# Patient Record
Sex: Female | Born: 1980 | Hispanic: Yes | Smoking: Never smoker
Health system: Southern US, Community
[De-identification: ages and names within clinical notes are randomized; demographics above are authoritative.]

## PROBLEM LIST (undated history)

## (undated) DIAGNOSIS — Z789 Other specified health status: Secondary | ICD-10-CM

## (undated) HISTORY — PX: NO PAST SURGERIES: SHX2092

---

## 2009-11-01 ENCOUNTER — Ambulatory Visit: Payer: Self-pay | Admitting: Obstetrics and Gynecology

## 2009-11-01 ENCOUNTER — Inpatient Hospital Stay (HOSPITAL_COMMUNITY): Admission: AD | Admit: 2009-11-01 | Discharge: 2009-11-01 | Payer: Self-pay | Admitting: Obstetrics & Gynecology

## 2010-01-06 ENCOUNTER — Ambulatory Visit (HOSPITAL_COMMUNITY): Admission: RE | Admit: 2010-01-06 | Discharge: 2010-01-06 | Payer: Self-pay | Admitting: Family Medicine

## 2010-05-21 ENCOUNTER — Inpatient Hospital Stay (HOSPITAL_COMMUNITY)
Admission: AD | Admit: 2010-05-21 | Discharge: 2010-05-23 | Payer: Self-pay | Source: Home / Self Care | Admitting: Obstetrics & Gynecology

## 2010-05-21 ENCOUNTER — Ambulatory Visit: Payer: Self-pay | Admitting: Obstetrics & Gynecology

## 2010-10-28 LAB — RPR: RPR Ser Ql: NONREACTIVE

## 2010-10-28 LAB — CBC
HCT: 34.6 % — ABNORMAL LOW (ref 36.0–46.0)
Hemoglobin: 11.9 g/dL — ABNORMAL LOW (ref 12.0–15.0)
MCH: 34.3 pg — ABNORMAL HIGH (ref 26.0–34.0)
MCHC: 34.4 g/dL (ref 30.0–36.0)
MCHC: 34.5 g/dL (ref 30.0–36.0)
Platelets: 257 10*3/uL (ref 150–400)
RBC: 3.47 MIL/uL — ABNORMAL LOW (ref 3.87–5.11)
RBC: 4.02 MIL/uL (ref 3.87–5.11)
RDW: 14.2 % (ref 11.5–15.5)
WBC: 11.6 10*3/uL — ABNORMAL HIGH (ref 4.0–10.5)

## 2010-11-08 LAB — WET PREP, GENITAL: Yeast Wet Prep HPF POC: NONE SEEN

## 2010-11-08 LAB — CBC
MCV: 95.4 fL (ref 78.0–100.0)
RBC: 4.25 MIL/uL (ref 3.87–5.11)
RDW: 13.4 % (ref 11.5–15.5)
WBC: 11.4 10*3/uL — ABNORMAL HIGH (ref 4.0–10.5)

## 2010-11-08 LAB — URINALYSIS, ROUTINE W REFLEX MICROSCOPIC
Glucose, UA: NEGATIVE mg/dL
Hgb urine dipstick: NEGATIVE
Nitrite: NEGATIVE
Urobilinogen, UA: 0.2 mg/dL (ref 0.0–1.0)

## 2010-11-08 LAB — ABO/RH: ABO/RH(D): O POS

## 2012-04-23 ENCOUNTER — Emergency Department (HOSPITAL_COMMUNITY)
Admission: EM | Admit: 2012-04-23 | Discharge: 2012-04-23 | Disposition: A | Discharge status: Home / Self Care | Payer: No Typology Code available for payment source | Attending: Emergency Medicine | Admitting: Emergency Medicine

## 2012-04-23 ENCOUNTER — Encounter (HOSPITAL_COMMUNITY): Payer: Self-pay | Admitting: *Deleted

## 2012-04-23 ENCOUNTER — Emergency Department (HOSPITAL_COMMUNITY): Payer: Self-pay

## 2012-04-23 DIAGNOSIS — M545 Low back pain, unspecified: Secondary | ICD-10-CM | POA: Insufficient documentation

## 2012-04-23 DIAGNOSIS — R109 Unspecified abdominal pain: Secondary | ICD-10-CM | POA: Insufficient documentation

## 2012-04-23 LAB — POCT PREGNANCY, URINE
Preg Test, Ur: NEGATIVE
Preg Test, Ur: NEGATIVE

## 2012-04-23 LAB — URINALYSIS, ROUTINE W REFLEX MICROSCOPIC
Bilirubin Urine: NEGATIVE
Glucose, UA: NEGATIVE mg/dL
Hgb urine dipstick: NEGATIVE
Ketones, ur: NEGATIVE mg/dL
Leukocytes, UA: NEGATIVE
Nitrite: NEGATIVE
Protein, ur: NEGATIVE mg/dL
Specific Gravity, Urine: 1.005 (ref 1.005–1.030)
Urobilinogen, UA: 0.2 mg/dL (ref 0.0–1.0)
pH: 6.5 (ref 5.0–8.0)

## 2012-04-23 MED ORDER — CYCLOBENZAPRINE HCL 10 MG PO TABS: 10.0000 mg | ORAL_TABLET | Freq: Two times a day (BID) | ORAL | Status: AC | PRN

## 2012-04-23 MED ORDER — OXYCODONE-ACETAMINOPHEN 5-325 MG PO TABS: 1.0000 | ORAL_TABLET | Freq: Four times a day (QID) | ORAL | Status: AC | PRN

## 2012-04-23 MED ORDER — OXYCODONE-ACETAMINOPHEN 5-325 MG PO TABS
1.0000 | ORAL_TABLET | Freq: Once | ORAL | Status: AC
  Administered 2012-04-23: 1 via ORAL
  Filled 2012-04-23: qty 1

## 2012-04-23 NOTE — ED Notes (Signed)
Pt discharged home with husband; ambulatory; states back pain is better
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
## 2012-04-23 NOTE — ED Notes (Signed)
Pt does not speak any english, waiting on husband to arrive.
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
## 2014-03-24 ENCOUNTER — Other Ambulatory Visit (HOSPITAL_COMMUNITY): Payer: Self-pay | Admitting: Nurse Practitioner

## 2014-03-24 DIAGNOSIS — Z3682 Encounter for antenatal screening for nuchal translucency: Secondary | ICD-10-CM

## 2014-03-26 ENCOUNTER — Encounter (HOSPITAL_COMMUNITY): Payer: Self-pay

## 2014-03-26 ENCOUNTER — Ambulatory Visit (HOSPITAL_COMMUNITY)
Admission: RE | Admit: 2014-03-26 | Discharge: 2014-03-26 | Disposition: A | Discharge status: Home / Self Care | Payer: Medicaid Other | Source: Ambulatory Visit | Attending: Nurse Practitioner | Admitting: Nurse Practitioner

## 2014-03-26 ENCOUNTER — Other Ambulatory Visit: Payer: Self-pay

## 2014-03-26 DIAGNOSIS — Z3682 Encounter for antenatal screening for nuchal translucency: Secondary | ICD-10-CM

## 2014-03-26 DIAGNOSIS — Z36 Encounter for antenatal screening of mother: Secondary | ICD-10-CM | POA: Diagnosis not present

## 2014-03-26 DIAGNOSIS — O3510X0 Maternal care for (suspected) chromosomal abnormality in fetus, unspecified, not applicable or unspecified: Secondary | ICD-10-CM | POA: Insufficient documentation

## 2014-03-26 DIAGNOSIS — O351XX0 Maternal care for (suspected) chromosomal abnormality in fetus, not applicable or unspecified: Secondary | ICD-10-CM | POA: Diagnosis not present

## 2014-04-18 ENCOUNTER — Other Ambulatory Visit (HOSPITAL_COMMUNITY): Payer: Self-pay | Admitting: Nurse Practitioner

## 2014-04-18 DIAGNOSIS — Z3689 Encounter for other specified antenatal screening: Secondary | ICD-10-CM

## 2014-05-09 ENCOUNTER — Ambulatory Visit (HOSPITAL_COMMUNITY)
Admission: RE | Admit: 2014-05-09 | Discharge: 2014-05-09 | Disposition: A | Discharge status: Home / Self Care | Payer: Medicaid Other | Source: Ambulatory Visit | Attending: Nurse Practitioner | Admitting: Nurse Practitioner

## 2014-05-09 ENCOUNTER — Other Ambulatory Visit (HOSPITAL_COMMUNITY): Payer: Self-pay | Admitting: Nurse Practitioner

## 2014-05-09 DIAGNOSIS — Z1389 Encounter for screening for other disorder: Secondary | ICD-10-CM | POA: Diagnosis not present

## 2014-05-09 DIAGNOSIS — Z3689 Encounter for other specified antenatal screening: Secondary | ICD-10-CM | POA: Diagnosis present

## 2014-05-09 DIAGNOSIS — Z363 Encounter for antenatal screening for malformations: Secondary | ICD-10-CM | POA: Insufficient documentation

## 2014-06-16 ENCOUNTER — Encounter (HOSPITAL_COMMUNITY): Payer: Self-pay

## 2014-07-18 LAB — OB RESULTS CONSOLE ABO/RH: RH TYPE: NEGATIVE

## 2014-07-18 LAB — OB RESULTS CONSOLE GC/CHLAMYDIA
Chlamydia: NEGATIVE
GC PROBE AMP, GENITAL: NEGATIVE

## 2014-07-18 LAB — OB RESULTS CONSOLE HEPATITIS B SURFACE ANTIGEN: Hepatitis B Surface Ag: NEGATIVE

## 2014-07-18 LAB — OB RESULTS CONSOLE HIV ANTIBODY (ROUTINE TESTING): HIV: NONREACTIVE

## 2014-07-18 LAB — OB RESULTS CONSOLE RUBELLA ANTIBODY, IGM: Rubella: IMMUNE

## 2014-07-18 LAB — OB RESULTS CONSOLE RPR: RPR: NONREACTIVE

## 2014-08-15 NOTE — L&D Delivery Note (Cosign Needed)
Delivery Note At 2103 a viable female was delivered via SVD (Presentation: OA ).  APGAR: 9,9 ; weight pending  .   Placenta status: spontaneous,intact .  Cord: 3v with the following complications:none .  Cord pH: na Anesthesia:  none Episiotomy:  none Lacerations:  none Suture Repair: na Est. Blood Loss (mL):  300 ml  Mom to postpartum.  Baby to Couplet care / Skin to Skin.  Rolm BookbinderMoss, Amber 09/25/2014, 9:15 PM   I was present for the delivery and agree with the above

## 2014-09-04 LAB — OB RESULTS CONSOLE GBS: GBS: NEGATIVE

## 2014-09-25 ENCOUNTER — Inpatient Hospital Stay (HOSPITAL_COMMUNITY)
Admission: AD | Admit: 2014-09-25 | Discharge: 2014-09-27 | DRG: 775 | Disposition: A | Discharge status: Home / Self Care | Payer: Medicaid Other | Source: Ambulatory Visit | Attending: Obstetrics & Gynecology | Admitting: Obstetrics & Gynecology

## 2014-09-25 ENCOUNTER — Encounter (HOSPITAL_COMMUNITY): Payer: Self-pay | Admitting: *Deleted

## 2014-09-25 DIAGNOSIS — Z3A39 39 weeks gestation of pregnancy: Secondary | ICD-10-CM | POA: Diagnosis present

## 2014-09-25 DIAGNOSIS — IMO0001 Reserved for inherently not codable concepts without codable children: Secondary | ICD-10-CM

## 2014-09-25 DIAGNOSIS — O471 False labor at or after 37 completed weeks of gestation: Secondary | ICD-10-CM | POA: Diagnosis present

## 2014-09-25 HISTORY — DX: Other specified health status: Z78.9

## 2014-09-25 LAB — TYPE AND SCREEN
ABO/RH(D): O POS
Antibody Screen: NEGATIVE

## 2014-09-25 LAB — CBC
HEMATOCRIT: 40.8 % (ref 36.0–46.0)
Hemoglobin: 14 g/dL (ref 12.0–15.0)
MCH: 32.3 pg (ref 26.0–34.0)
MCHC: 34.3 g/dL (ref 30.0–36.0)
MCV: 94.2 fL (ref 78.0–100.0)
Platelets: 243 10*3/uL (ref 150–400)
RBC: 4.33 MIL/uL (ref 3.87–5.11)
RDW: 13.8 % (ref 11.5–15.5)
WBC: 6 10*3/uL (ref 4.0–10.5)

## 2014-09-25 MED ORDER — ONDANSETRON HCL 4 MG/2ML IJ SOLN: 4.0000 mg | Freq: Four times a day (QID) | INTRAMUSCULAR | Status: DC | PRN

## 2014-09-25 MED ORDER — OXYTOCIN BOLUS FROM INFUSION: 500.0000 mL | INTRAVENOUS | Status: DC

## 2014-09-25 MED ORDER — OXYTOCIN 40 UNITS IN LACTATED RINGERS INFUSION - SIMPLE MED
62.5000 mL/h | INTRAVENOUS | Status: DC
  Filled 2014-09-25: qty 1000

## 2014-09-25 MED ORDER — LACTATED RINGERS IV SOLN
INTRAVENOUS | Status: DC
  Administered 2014-09-25: 16:00:00 via INTRAVENOUS

## 2014-09-25 MED ORDER — FLEET ENEMA 7-19 GM/118ML RE ENEM: 1.0000 | ENEMA | RECTAL | Status: DC | PRN

## 2014-09-25 MED ORDER — FENTANYL CITRATE 0.05 MG/ML IJ SOLN
100.0000 ug | INTRAMUSCULAR | Status: DC | PRN
  Administered 2014-09-25: 100 ug via INTRAVENOUS
  Filled 2014-09-25: qty 2

## 2014-09-25 MED ORDER — CITRIC ACID-SODIUM CITRATE 334-500 MG/5ML PO SOLN: 30.0000 mL | ORAL | Status: DC | PRN

## 2014-09-25 MED ORDER — LACTATED RINGERS IV SOLN: 500.0000 mL | INTRAVENOUS | Status: DC | PRN

## 2014-09-25 MED ORDER — CITRIC ACID-SODIUM CITRATE 334-500 MG/5ML PO SOLN
30.0000 mL | ORAL | Status: DC | PRN
Start: 2014-09-25 — End: 2014-09-25

## 2014-09-25 MED ORDER — OXYTOCIN BOLUS FROM INFUSION
500.0000 mL | INTRAVENOUS | Status: DC
  Administered 2014-09-25: 500 mL via INTRAVENOUS

## 2014-09-25 MED ORDER — FLEET ENEMA 7-19 GM/118ML RE ENEM
1.0000 | ENEMA | RECTAL | Status: DC | PRN
Start: 2014-09-25 — End: 2014-09-25

## 2014-09-25 MED ORDER — OXYCODONE-ACETAMINOPHEN 5-325 MG PO TABS: 1.0000 | ORAL_TABLET | ORAL | Status: DC | PRN

## 2014-09-25 MED ORDER — OXYTOCIN 40 UNITS IN LACTATED RINGERS INFUSION - SIMPLE MED: 62.5000 mL/h | INTRAVENOUS | Status: DC

## 2014-09-25 MED ORDER — OXYCODONE-ACETAMINOPHEN 5-325 MG PO TABS: 2.0000 | ORAL_TABLET | ORAL | Status: DC | PRN

## 2014-09-25 MED ORDER — IBUPROFEN 600 MG PO TABS
600.0000 mg | ORAL_TABLET | Freq: Four times a day (QID) | ORAL | Status: DC
  Administered 2014-09-25 – 2014-09-27 (×7): 600 mg via ORAL
  Filled 2014-09-25 (×7): qty 1

## 2014-09-25 MED ORDER — LACTATED RINGERS IV SOLN: INTRAVENOUS | Status: DC

## 2014-09-25 MED ORDER — ACETAMINOPHEN 325 MG PO TABS: 650.0000 mg | ORAL_TABLET | ORAL | Status: DC | PRN

## 2014-09-25 MED ORDER — LIDOCAINE HCL (PF) 1 % IJ SOLN
30.0000 mL | INTRAMUSCULAR | Status: DC | PRN
  Filled 2014-09-25: qty 30

## 2014-09-25 MED ORDER — LIDOCAINE HCL (PF) 1 % IJ SOLN: 30.0000 mL | INTRAMUSCULAR | Status: DC | PRN

## 2014-09-25 NOTE — MAU Note (Signed)
Patient presents at 6339 weeks gestation with complaint of regular contractions since this morning around 0700.

## 2014-09-25 NOTE — Progress Notes (Signed)
I assisted Doctor, hospitalhannon RN with some questions and I ordered patients meal, by Orlan LeavensViria Alvarez Spanish Interpreter

## 2014-09-25 NOTE — H&P (Signed)
Chief Complaint:  Labor Eval  HPI: Francene CastleSury Y Gomez-Alonzo is a 34 y.o. G3P2002 at 5827w4d who presents to maternity admissions reporting contractions since 0700 today. Contractions have progressively increased in frequency and discomfort throughout the day. Patient decided to come to the MAU. In the MAU patient was checked and found to be 5cm, and 70%. She was reported as being 1cm at her last OB visit 15 days ago. She was monitored and rechecked 60min later and was found to be 5.5cm, 80%, and with a newly bulging bag. At this point it was deemed appropriate to admit.   Denies HA, vision changes, n/v/d/c, edema, leakage of fluid, or vaginal bleeding. Good fetal movement.   [facility]   Past Medical History: Past Medical History  Diagnosis Date  . Medical history non-contributory     Past obstetric history: OB History  Gravida Para Term Preterm AB SAB TAB Ectopic Multiple Living  3 2 2  0 0 0 0 0 0 2    # Outcome Date GA Lbr Len/2nd Weight Sex Delivery Anes PTL Lv  3 Current           2 Term 2011 7033w0d  3.714 kg (8 lb 3 oz) F Vag-Spont None  Y  1 Term 2009 7033w0d  3.175 kg (7 lb) F Vag-Spont None  Y      Past Surgical History: Past Surgical History  Procedure Laterality Date  . No past surgeries       Family History: History reviewed. No pertinent family history.  Social History: History  Substance Use Topics  . Smoking status: Never Smoker   . Smokeless tobacco: Never Used  . Alcohol Use: No    Allergies: No Known Allergies  Meds:  Prescriptions prior to admission  Medication Sig Dispense Refill Last Dose  . Prenatal Vit w/Fe-Methylfol-FA (PNV PO) Take 1 tablet by mouth daily.    Past Week at Unknown time    ROS: Pertinent findings in history of present illness.  Physical Exam  Blood pressure 133/80, pulse 73, temperature 97.6 F (36.4 C), temperature source Oral, resp. rate 18, height 5' (1.524 m), weight 77.111 kg (170  lb), last menstrual period 12/22/2013. GENERAL: Well-developed, well-nourished female in no acute distress.  HEENT: normocephalic, MMM, EOMI HEART: normal rate, no murmurs RESP: normal effort, CTAB ABDOMEN: Soft, non-tender, gravid appropriate for gestational age EXTREMITIES: Nontender, no edema, pulses equal and present bilaterally NEURO: alert and oriented  Dilation: 5.5 Effacement (%): 80 Cervical Position: Middle Station: -2 Presentation: Vertex Exam by:: Dellie BurnsScarlett Murray, RN BSN (Membranes bulging)  FHT:  Baseline 140, moderate variability, accelerations present, no decelerations Contractions: q 4 mins   Labs: No results found for this or any previous visit (from the past 24 hour(s)).  Imaging:  No results found.   Assessment: 1. Labor: active 2. Fetal Wellbeing: Category 1  3. Pain Control: none 4. GBS: neg 5. 39.4 week IUP  Plan:  1. Admit to BS per consult with MD 2. Routine L&D orders 3. Analgesia/anesthesia PRN      Medication List    ASK your doctor about these medications        PNV PO  Take 1 tablet by mouth daily.        Kathee DeltonIan D McKeag, MD 09/25/2014 3:37 PM   OB fellow attestation:  I have seen and examined this patient; I agree with above documentation in the resident's note.   Francene CastleSury Y Gomez-Alonzo is  a 34 y.o. [id number] here in active labor  PE: BP 122/77 mmHg  Pulse 71  Temp(Src) 97.9 F (36.6 C) (Oral)  Resp 16  Ht 5' (1.524 m)  Wt 170 lb (77.111 kg)  BMI 33.20 kg/m2  LMP 12/22/2013 Gen: calm comfortable, NAD Resp: normal effort, no distress Abd: gravid  ROS, labs, PMH reviewed  Plan: Plan as above, expectant mgmt, GBS neg  Vaudie Engebretsen ROCIO 09/25/2014, 5:57 PM

## 2014-09-25 NOTE — Progress Notes (Signed)
   Francene CastleSury Y Gomez-Alonzo is a 34 y.o. G3P2002 at 7753w4d  admitted for active labor  Subjective:  Desires IV meds  Objective: Filed Vitals:   09/25/14 1336 09/25/14 1552  BP: 133/80 122/77  Pulse: 73 71  Temp: 97.6 F (36.4 C) 97.9 F (36.6 C)  TempSrc: Oral Oral  Resp: 18 16  Height: 5' (1.524 m) 5' (1.524 m)  Weight: 170 lb (77.111 kg) 170 lb (77.111 kg)      FHT:  FHR: 140 bpm, variability: moderate,  accelerations:  Present,  decelerations:  Absent UC:   regular, every 2-3 minutes SVE:   6-7/100/-2 AROM with clear fluid  Labs: Lab Results  Component Value Date   WBC 6.0 09/25/2014   HGB 14.0 09/25/2014   HCT 40.8 09/25/2014   MCV 94.2 09/25/2014   PLT 243 09/25/2014    Assessment / Plan: Spontaneous labor, progressing normally  Labor: Progressing normally Fetal Wellbeing:  Category I Pain Control:  Fentanyl Anticipated MOD:  NSVD  CRESENZO-DISHMAN,Paulino Cork 09/25/2014, 7:43 PM

## 2014-09-26 LAB — RPR: RPR: NONREACTIVE

## 2014-09-26 MED ORDER — WITCH HAZEL-GLYCERIN EX PADS: 1.0000 "application " | MEDICATED_PAD | CUTANEOUS | Status: DC | PRN

## 2014-09-26 MED ORDER — SIMETHICONE 80 MG PO CHEW: 80.0000 mg | CHEWABLE_TABLET | ORAL | Status: DC | PRN

## 2014-09-26 MED ORDER — ONDANSETRON HCL 4 MG PO TABS: 4.0000 mg | ORAL_TABLET | ORAL | Status: DC | PRN

## 2014-09-26 MED ORDER — TETANUS-DIPHTH-ACELL PERTUSSIS 5-2.5-18.5 LF-MCG/0.5 IM SUSP: 0.5000 mL | Freq: Once | INTRAMUSCULAR | Status: DC

## 2014-09-26 MED ORDER — BENZOCAINE-MENTHOL 20-0.5 % EX AERO: 1.0000 "application " | INHALATION_SPRAY | CUTANEOUS | Status: DC | PRN

## 2014-09-26 MED ORDER — OXYCODONE-ACETAMINOPHEN 5-325 MG PO TABS
1.0000 | ORAL_TABLET | ORAL | Status: DC | PRN
  Administered 2014-09-26: 1 via ORAL
  Filled 2014-09-26: qty 1

## 2014-09-26 MED ORDER — BISACODYL 10 MG RE SUPP: 10.0000 mg | Freq: Every day | RECTAL | Status: DC | PRN

## 2014-09-26 MED ORDER — SENNOSIDES-DOCUSATE SODIUM 8.6-50 MG PO TABS
2.0000 | ORAL_TABLET | ORAL | Status: DC
  Administered 2014-09-27: 2 via ORAL
  Filled 2014-09-26: qty 2

## 2014-09-26 MED ORDER — DIBUCAINE 1 % RE OINT: 1.0000 "application " | TOPICAL_OINTMENT | RECTAL | Status: DC | PRN

## 2014-09-26 MED ORDER — DIPHENHYDRAMINE HCL 25 MG PO CAPS: 25.0000 mg | ORAL_CAPSULE | Freq: Four times a day (QID) | ORAL | Status: DC | PRN

## 2014-09-26 MED ORDER — MEASLES, MUMPS & RUBELLA VAC ~~LOC~~ INJ
0.5000 mL | INJECTION | Freq: Once | SUBCUTANEOUS | Status: DC
  Filled 2014-09-26: qty 0.5

## 2014-09-26 MED ORDER — METHYLERGONOVINE MALEATE 0.2 MG/ML IJ SOLN: 0.2000 mg | INTRAMUSCULAR | Status: DC | PRN

## 2014-09-26 MED ORDER — OXYCODONE-ACETAMINOPHEN 5-325 MG PO TABS: 2.0000 | ORAL_TABLET | ORAL | Status: DC | PRN

## 2014-09-26 MED ORDER — PRENATAL MULTIVITAMIN CH
1.0000 | ORAL_TABLET | Freq: Every day | ORAL | Status: DC
  Administered 2014-09-26 – 2014-09-27 (×2): 1 via ORAL
  Filled 2014-09-26 (×2): qty 1

## 2014-09-26 MED ORDER — ONDANSETRON HCL 4 MG/2ML IJ SOLN: 4.0000 mg | INTRAMUSCULAR | Status: DC | PRN

## 2014-09-26 MED ORDER — FERROUS SULFATE 325 (65 FE) MG PO TABS
325.0000 mg | ORAL_TABLET | Freq: Two times a day (BID) | ORAL | Status: DC
  Administered 2014-09-26 – 2014-09-27 (×3): 325 mg via ORAL
  Filled 2014-09-26 (×3): qty 1

## 2014-09-26 MED ORDER — LANOLIN HYDROUS EX OINT
TOPICAL_OINTMENT | CUTANEOUS | Status: DC | PRN
Start: 2014-09-26 — End: 2014-09-27

## 2014-09-26 MED ORDER — ZOLPIDEM TARTRATE 5 MG PO TABS
5.0000 mg | ORAL_TABLET | Freq: Every evening | ORAL | Status: DC | PRN
Start: 2014-09-26 — End: 2014-09-27

## 2014-09-26 MED ORDER — FLEET ENEMA 7-19 GM/118ML RE ENEM: 1.0000 | ENEMA | Freq: Every day | RECTAL | Status: DC | PRN

## 2014-09-26 MED ORDER — METHYLERGONOVINE MALEATE 0.2 MG PO TABS: 0.2000 mg | ORAL_TABLET | ORAL | Status: DC | PRN

## 2014-09-26 MED ORDER — OXYTOCIN 40 UNITS IN LACTATED RINGERS INFUSION - SIMPLE MED: 62.5000 mL/h | INTRAVENOUS | Status: DC | PRN

## 2014-09-26 NOTE — Progress Notes (Signed)
UR chart review completed.  

## 2014-09-26 NOTE — Progress Notes (Signed)
I stopped by patient's room to check on her needs, I ordered her dinner and breakfast, I also assisted Evangeline GulaKaren RN with some questions during the shift change, by Orlan LeavensViria Alvarez Spanish Interpreter.

## 2014-09-26 NOTE — Plan of Care (Signed)
Problem: Phase II Progression Outcomes Goal: Initiate breastfeeding within 1hr delivery Outcome: Not Met (add Reason) Breastfeeding initiated within 90 minutes of delivery.  Delayed due to patient preference. Newborn kept skin-to-skin until breastfeeding initiated.

## 2014-09-26 NOTE — Lactation Note (Signed)
This note was copied from the chart of Kelsey Thomas. Lactation Consultation Note  Kenny[employer] Interpreter# [phone] for Spanish Mother states she knows how to hand express but states it is only a little bit. Reviewed size of baby's stomach, supply and demand. Mother denies problems or questions. Discussed Cluster feeding.  Mom encouraged to feed baby 8-12 times/24 hours and with feeding cues.  Mom made aware of O/P services, breastfeeding support groups, community resources, and our phone # for post-discharge questions in Spanish.     Patient Name: Kelsey Thomas EAVWU'JToday's Date: 09/26/2014 Reason for consult: Initial assessment   Maternal Data Has patient been taught Hand Expression?: Yes Does the patient have breastfeeding experience prior to this delivery?: Yes  Feeding    LATCH Score/Interventions                      Lactation Tools Discussed/Used     Consult Status Consult Status: Follow-up Date: 09/27/14 Follow-up type: In-patient    Dahlia ByesBerkelhammer, Tierany Appleby [facility] 09/26/2014, 1:51 PM

## 2014-09-26 NOTE — Progress Notes (Signed)
Post Partum Day 1 Subjective: Pt is a 33y/o G3P3003 PPD#1 after a SVD @2103 . She reports no complaints overnight. She is able to have a BM and void well. No pain overnight. Feeding method: breast/bottle. Contraception: Nexplanon. Pt had no questions.    Objective: Blood pressure 98/58, pulse 64, temperature 98.2 F (36.8 C), temperature source Oral, resp. rate 16, height 5' (1.524 m), weight 77.111 kg (170 lb), last menstrual period 12/22/2013, unknown if currently breastfeeding.  Physical Exam:  General: alert, cooperative and no distress Lochia: appropriate Uterine Fundus: soft DVT Evaluation: No evidence of DVT seen on physical exam.   Recent Labs  09/25/14 1535  HGB 14.0  HCT 40.8    Assessment/Plan: Plan for discharge tomorrow   LOS: 1 day   Eston EstersMohr, Naftali Carchi M 09/26/2014, 7:30 AM

## 2014-09-26 NOTE — Progress Notes (Signed)
[employer] Spanish-language interpreter called and used at [email or web address] for communication between CNM, RN and patient and for cervical check, and at 2055 for delivery and recovery.

## 2014-09-27 NOTE — Discharge Summary (Signed)
Obstetric Discharge Summary Reason for Admission: onset of labor Prenatal Procedures: NST and ultrasound Intrapartum Procedures: spontaneous vaginal delivery Postpartum Procedures: none Complications-Operative and Postpartum: none HEMOGLOBIN  Date Value Ref Range Status  09/25/2014 14.0 12.0 - 15.0 g/dL Final   HCT  Date Value Ref Range Status  09/25/2014 40.8 36.0 - 46.0 % Final    Physical Exam:  General: alert, cooperative, appears stated age and no distress Lochia: appropriate Uterine Fundus: firm Incision: n/a DVT Evaluation: No evidence of DVT seen on physical exam. No cords or calf tenderness. No significant calf/ankle edema.  Discharge Diagnoses: Term Pregnancy-delivered  Discharge Information: Date: 09/27/2014 Activity: pelvic rest Diet: routine Medications: PNV Condition: stable Instructions: refer to practice specific booklet Discharge to: home Follow-up Information    [facility] [place] Schedule an appointment as soon as possible for a visit in 6 weeks.   Why:  For your postpartum appointment.   Contact information:   [address] [place] [id number] [phone]      Newborn Data: Live born female  Birth Weight: 8 lb 15.7 oz (4075 g) APGAR: 9, 10  Home with mother.  Kathee DeltonMcKeag, Ian D 09/27/2014, 8:03 AM   I have seen and examined this patient and I agree with the above. Breast/bottlefeeding and considering Nexplanon for contraception. Cam HaiSHAW, Jeanpierre Thebeau CNM 9:01 AM 09/27/2014

## 2014-09-27 NOTE — Discharge Instructions (Signed)

## 2014-09-27 NOTE — Lactation Note (Signed)
This note was copied from the chart of Kelsey Thomas. Lactation Consultation Note; Follow up visit with mom. She is asleep but dad states BF is going well. Experienced BF mom with last LS of 9. To call prn.   Patient Name: Kelsey Thomas ZOXWR'UToday's Date: 09/27/2014 Reason for consult: Follow-up assessment   Maternal Data Formula Feeding for Exclusion: Yes Does the patient have breastfeeding experience prior to this delivery?: Yes  Feeding   LATCH Score/Interventions                      Lactation Tools Discussed/Used     Consult Status Consult Status: Complete    Pamelia HoitWeeks, Mylz Yuan D 09/27/2014, 10:28 AM

## 2019-07-13 ENCOUNTER — Emergency Department (HOSPITAL_COMMUNITY)
Admission: EM | Admit: 2019-07-13 | Discharge: 2019-07-14 | Disposition: A | Discharge status: Home / Self Care | Payer: Self-pay | Attending: Emergency Medicine | Admitting: Emergency Medicine

## 2019-07-13 ENCOUNTER — Other Ambulatory Visit: Payer: Self-pay

## 2019-07-13 ENCOUNTER — Encounter (HOSPITAL_COMMUNITY): Payer: Self-pay | Admitting: Emergency Medicine

## 2019-07-13 DIAGNOSIS — R350 Frequency of micturition: Secondary | ICD-10-CM | POA: Insufficient documentation

## 2019-07-13 DIAGNOSIS — N3000 Acute cystitis without hematuria: Secondary | ICD-10-CM | POA: Insufficient documentation

## 2019-07-13 DIAGNOSIS — R1013 Epigastric pain: Secondary | ICD-10-CM | POA: Insufficient documentation

## 2019-07-13 DIAGNOSIS — R1110 Vomiting, unspecified: Secondary | ICD-10-CM | POA: Insufficient documentation

## 2019-07-13 DIAGNOSIS — R6883 Chills (without fever): Secondary | ICD-10-CM | POA: Insufficient documentation

## 2019-07-13 LAB — URINALYSIS, ROUTINE W REFLEX MICROSCOPIC
Bacteria, UA: NONE SEEN
Bilirubin Urine: NEGATIVE
Glucose, UA: NEGATIVE mg/dL
Ketones, ur: NEGATIVE mg/dL
Nitrite: NEGATIVE
Protein, ur: 30 mg/dL — AB
RBC / HPF: >50 RBC/hpf — ABNORMAL HIGH (ref 0–5)
Specific Gravity, Urine: 1.025 (ref 1.005–1.030)
pH: 5 (ref 5.0–8.0)

## 2019-07-13 LAB — CBC
HCT: 40.8 % (ref 36.0–46.0)
Hemoglobin: 13.6 g/dL (ref 12.0–15.0)
MCH: 31.6 pg (ref 26.0–34.0)
MCHC: 33.3 g/dL (ref 30.0–36.0)
MCV: 94.7 fL (ref 80.0–100.0)
Platelets: 375 10*3/uL (ref 150–400)
RBC: 4.31 MIL/uL (ref 3.87–5.11)
RDW: 13.3 % (ref 11.5–15.5)
WBC: 13.6 10*3/uL — ABNORMAL HIGH (ref 4.0–10.5)
nRBC: 0 % (ref 0.0–0.2)

## 2019-07-13 LAB — COMPREHENSIVE METABOLIC PANEL
ALT: 63 U/L — ABNORMAL HIGH (ref 0–44)
AST: 39 U/L (ref 15–41)
Albumin: 4.2 g/dL (ref 3.5–5.0)
Alkaline Phosphatase: 68 U/L (ref 38–126)
Anion gap: 9 (ref 5–15)
BUN: 14 mg/dL (ref 6–20)
CO2: 22 mmol/L (ref 22–32)
Calcium: 9.7 mg/dL (ref 8.9–10.3)
Chloride: 107 mmol/L (ref 98–111)
Creatinine, Ser: 0.73 mg/dL (ref 0.44–1.00)
GFR calc Af Amer: >60 mL/min (ref >60)
GFR calc non Af Amer: >60 mL/min (ref >60)
Glucose, Bld: 122 mg/dL — ABNORMAL HIGH (ref 70–99)
Potassium: 4 mmol/L (ref 3.5–5.1)
Sodium: 138 mmol/L (ref 135–145)
Total Bilirubin: 0.5 mg/dL (ref 0.3–1.2)
Total Protein: 7.7 g/dL (ref 6.5–8.1)

## 2019-07-13 LAB — I-STAT BETA HCG BLOOD, ED (MC, WL, AP ONLY): I-stat hCG, quantitative: <5 m[IU]/mL (ref <5)

## 2019-07-13 LAB — LIPASE, BLOOD: Lipase: 37 U/L (ref 11–51)

## 2019-07-13 MED ORDER — SODIUM CHLORIDE 0.9% FLUSH: 3.0000 mL | Freq: Once | INTRAVENOUS | Status: DC

## 2019-07-13 NOTE — ED Triage Notes (Signed)
Spanish speaker pt c/o left side abd pain radiating to her back with nausea and vomiting. No fever or chills denies any urinary symptoms.

## 2019-07-14 ENCOUNTER — Emergency Department (HOSPITAL_COMMUNITY): Payer: Self-pay

## 2019-07-14 MED ORDER — ONDANSETRON HCL 4 MG/2ML IJ SOLN
4.0000 mg | Freq: Once | INTRAMUSCULAR | Status: AC
  Administered 2019-07-14: 4 mg via INTRAVENOUS
  Filled 2019-07-14: qty 2

## 2019-07-14 MED ORDER — SODIUM CHLORIDE 0.9 % IV BOLUS
1000.0000 mL | Freq: Once | INTRAVENOUS | Status: AC
  Administered 2019-07-14: 1000 mL via INTRAVENOUS

## 2019-07-14 MED ORDER — ONDANSETRON 4 MG PO TBDP: 4.0000 mg | ORAL_TABLET | Freq: Three times a day (TID) | ORAL | 0 refills | Status: DC | PRN

## 2019-07-14 MED ORDER — MORPHINE SULFATE (PF) 4 MG/ML IV SOLN
4.0000 mg | Freq: Once | INTRAVENOUS | Status: AC
  Administered 2019-07-14: 4 mg via INTRAVENOUS
  Filled 2019-07-14: qty 1

## 2019-07-14 MED ORDER — SODIUM CHLORIDE 0.9 % IV SOLN
1.0000 g | Freq: Once | INTRAVENOUS | Status: AC
  Administered 2019-07-14: 1 g via INTRAVENOUS
  Filled 2019-07-14: qty 10

## 2019-07-14 MED ORDER — FOSFOMYCIN TROMETHAMINE 3 G PO PACK: PACK | ORAL | 0 refills | Status: DC

## 2019-07-14 NOTE — ED Notes (Signed)
Through an interpreter, she reportedly have been having abdominal pain for 2 days and has taking tylenol for it. Currently, she says it's at 10/10. Also complained that she had been throwing up at home.

## 2019-07-14 NOTE — Discharge Instructions (Addendum)
Gracias por permitirme atenderlo [employer] de Emergencias.  Tome 1 dosis de fosfomicina hoy y [place] segunda dosis en 48 horas.  1 tableta de Zofran se disuelve debajo de la lengua cada 8 horas segn sea necesario para las nuseas o los vmitos.  Tome 650 mg de Tylenol o 600 mg de ibuprofeno con alimentos cada 6 horas para el dolor. Puede alternar entre estos 2 medicamentos cada 3 horas si el dolor regresa. Por ejemplo, puede tomar Tylenol al medioda, seguido de una dosis de ibuprofeno a las 3, seguida de Ardelia Mems segunda dosis de Tylenol y 6.  Puede realizar un seguimiento con atencin primaria si sus sntomas persisten.  Regrese a la sala de emergencias si presenta fiebre alta, vmitos a pesar de tomar Zofran, dolor incontrolable a pesar de tomar Tylenol e ibuprofeno, u otros sntomas nuevos y preocupantes.  Thank you for allowing me to care for you today in the Emergency Department.   Take 1 dose of fosfomycin today and then a second dose in 48 hours.  1 tablet of Zofran dissolve under tongue every 8 hours as needed for nausea or vomiting.  Take 650 mg of Tylenol or 600 mg of ibuprofen with food every 6 hours for pain.  You can alternate between these 2 medications every 3 hours if your pain returns.  For instance, you can take Tylenol at noon, followed by a dose of ibuprofen at 3, followed by second dose of Tylenol and 6.  You can follow-up with primary care if your symptoms persist.  Return the emergency department if you develop high fevers, vomiting despite taking Zofran, uncontrollable pain despite taking Tylenol and ibuprofen, or other new, concerning symptoms.

## 2019-07-14 NOTE — ED Provider Notes (Signed)
[facility]PART[facility] Provider Note   CSN: [id number] Arrival date & time: 07/13/19  2028     History   Chief Complaint Chief Complaint  Patient presents with   Abdominal Pain    HPI Kelsey Thomas is a 38 y.o. female with no significant past medical history who presents to the emergency department with a chief complaint of flank pain.  Reports the pain began 2 days ago while she was walking.  The patient reports that she developed left flank pain 2 days ago.  She reports that the pain radiates to her epigastric region.  She is unable to characterize the pain, but states that it is constant and severe.  She is unsure of any factors that aggravate or improve the pain.  She reports that she has had vomiting since the onset of the pain, approximately 3 episodes over the last 24 hours.  Last episode of vomiting was at 20:00.  She reports associated urinary frequency and chills.  She denies fever, constipation, cough, chest pain, shortness of breath, hematuria, dysuria.  She is currently on her menstrual cycle.  No history of abdominal surgery.  She took 2 tablets of Tylenol earlier today with no improvement in her symptoms.    The history is provided by the patient. The history is limited by a language barrier. A language interpreter was used ([place]).    Past Medical History:  Diagnosis Date   Medical history non-contributory     Patient Active Problem List   Diagnosis Date Noted   Active labor at term 09/25/2014    Past Surgical History:  Procedure Laterality Date   NO PAST SURGERIES       OB History    Gravida  3   Para  3   Term  3   Preterm  0   AB  0   Living  3     SAB  0   TAB  0   Ectopic  0   Multiple  0   Live Births  3            Home Medications    Prior to Admission medications   Medication Sig Start Date End Date Taking? Authorizing Provider  fosfomycin (MONUROL) 3 g PACK Take 3 g by mouth  then repeat with a second dose in 2 days. 07/14/19   Briteny Fulghum A, PA-C  ondansetron (ZOFRAN ODT) 4 MG disintegrating tablet Take 1 tablet (4 mg total) by mouth every 8 (eight) hours as needed. 07/14/19   Temperence Zenor A, PA-C    Family History No family history on file.  Social History Social History   Tobacco Use   Smoking status: Never Smoker   Smokeless tobacco: Never Used  Substance Use Topics   Alcohol use: No   Drug use: No     Allergies   Patient has no known allergies.   Review of Systems Review of Systems  Constitutional: Positive for chills. Negative for activity change, diaphoresis and fever.  Eyes: Negative for visual disturbance.  Respiratory: Negative for shortness of breath and wheezing.   Cardiovascular: Negative for chest pain.  Gastrointestinal: Positive for abdominal pain, nausea and vomiting. Negative for blood in stool, constipation and rectal pain.  Genitourinary: Positive for flank pain, frequency and vaginal bleeding. Negative for dysuria, hematuria, menstrual problem, urgency, vaginal discharge and vaginal pain.  Musculoskeletal: Negative for back pain.  Skin: Negative for rash.  Allergic/Immunologic: Negative for immunocompromised state.  Neurological:  Negative for dizziness, seizures, syncope, weakness and headaches.  Psychiatric/Behavioral: Negative for confusion.     Physical Exam Updated Vital Signs BP 133/85    Pulse (!) 59    Temp 98.2 F (36.8 C) (Oral)    Resp 20    Ht '5\' 3"'  (1.6 m)    Wt 82.6 kg    SpO2 98%    BMI 32.24 kg/m   Physical Exam Vitals signs and nursing note reviewed.  Constitutional:      General: She is not in acute distress.    Appearance: She is not ill-appearing, toxic-appearing or diaphoretic.     Comments: Well-appearing.  No acute distress.  HENT:     Head: Normocephalic.  Eyes:     Conjunctiva/sclera: Conjunctivae normal.  Neck:     Musculoskeletal: Neck supple.  Cardiovascular:     Rate and  Rhythm: Normal rate and regular rhythm.     Heart sounds: No murmur. No friction rub. No gallop.   Pulmonary:     Effort: Pulmonary effort is normal. No respiratory distress.     Breath sounds: No stridor. No wheezing, rhonchi or rales.  Chest:     Chest wall: No tenderness.  Abdominal:     General: There is no distension.     Palpations: Abdomen is soft. There is no mass.     Tenderness: There is abdominal tenderness. There is left CVA tenderness. There is no right CVA tenderness, guarding or rebound.     Hernia: No hernia is present.     Comments: Focal tenderness to palpation over the left CVA.  No right CVA tenderness.  There is some epigastric tenderness without rebound or guarding.  Abdomen is otherwise soft, nontender, nondistended.  Normoactive bowel sounds.  Musculoskeletal:     Right lower leg: No edema.     Left lower leg: No edema.  Skin:    General: Skin is warm.     Findings: No rash.  Neurological:     Mental Status: She is alert.  Psychiatric:        Behavior: Behavior normal.      ED Treatments / Results  Labs (all labs ordered are listed, but only abnormal results are displayed) Labs Reviewed  COMPREHENSIVE METABOLIC PANEL - Abnormal; Notable for the following components:      Result Value   Glucose, Bld 122 (*)    ALT 63 (*)    All other components within normal limits  CBC - Abnormal; Notable for the following components:   WBC 13.6 (*)    All other components within normal limits  URINALYSIS, ROUTINE W REFLEX MICROSCOPIC - Abnormal; Notable for the following components:   APPearance CLOUDY (*)    Hgb urine dipstick LARGE (*)    Protein, ur 30 (*)    Leukocytes,Ua TRACE (*)    RBC / HPF >50 (*)    All other components within normal limits  URINE CULTURE  LIPASE, BLOOD  I-STAT BETA HCG BLOOD, ED ([facility], [facility], [facility] ONLY)    EKG None  Radiology Ct Renal Stone Study  Result Date: 07/14/2019 CLINICAL DATA:  Left-sided abdominal/flank pain with nausea  and vomiting. EXAM: CT ABDOMEN AND PELVIS WITHOUT CONTRAST TECHNIQUE: Multidetector CT imaging of the abdomen and pelvis was performed following the standard protocol without IV contrast. COMPARISON:  None. FINDINGS: Lower chest: Minimal atelectasis in the lower lobes and lingula. Mild atelectasis or scarring in the right middle lobe. No pleural effusion. Hepatobiliary: Diffusely decreased attenuation of the  liver consistent with steatosis. Unremarkable gallbladder. No biliary dilatation. Pancreas: Unremarkable. Spleen: Unremarkable. Adrenals/Urinary Tract: Unremarkable adrenal glands. No evidence of renal mass, calculi, or hydronephrosis. Unremarkable bladder. Stomach/Bowel: The stomach is unremarkable. There is no evidence of bowel obstruction or inflammation. The appendix is unremarkable. Vascular/Lymphatic: Normal caliber of the abdominal aorta. No enlarged lymph nodes. Reproductive: IUD in place. No adnexal mass. Other: No intraperitoneal free fluid. Musculoskeletal: No acute osseous abnormality or suspicious osseous lesion. IMPRESSION: 1. No acute abnormality identified in the abdomen or pelvis. 2. Hepatic steatosis. Electronically Signed   By: Logan Bores M.D.   On: 07/14/2019 06:29    Procedures Procedures (including critical care time)  Medications Ordered in ED Medications  sodium chloride flush (NS) 0.9 % injection 3 mL (has no administration in time range)  cefTRIAXone (ROCEPHIN) 1 g in sodium chloride 0.9 % 100 mL IVPB (0 g Intravenous Stopped 07/14/19 0627)  morphine 4 MG/ML injection 4 mg (4 mg Intravenous Given 07/14/19 0529)  ondansetron (ZOFRAN) injection 4 mg (4 mg Intravenous Given 07/14/19 0528)  sodium chloride 0.9 % bolus 1,000 mL (1,000 mLs Intravenous New Bag/Given 07/14/19 0528)     Initial Impression / Assessment and Plan / ED Course  I have reviewed the triage vital signs and the nursing notes.  Pertinent labs & imaging results that were available during my care of the  patient were reviewed by me and considered in my medical decision making (see chart for details).        38 year old female with no significant past medical history presenting with 2 days of left flank pain, chills, nausea, vomiting, and urinary frequency.  She is afebrile in the ER and is otherwise hemodynamically stable.  On exam, she is in no acute distress and has left CVA tenderness as well as some epigastric tenderness.  No peritoneal signs.  Labs are notable for leukocytosis of 13.6.  Her AST is mildly elevated at 63.  Normal AST, lipase, alk phos, and total bilirubin.  UA with large hemoglobinuria, trace leukocytes, mild proteinuria, pyuria, and many RBCs.  Calcium oxalate crystals are also noted.  Urine culture sent.  Given that the patient has left flank tenderness in the setting of urinary frequency and chills, I am concerned for complicated UTI.  There are RBCs and hemoglobinuria on her urinalysis, but she is currently on her menstrual cycle.  However because there are calcium oxylate crystals, will obtain CT stone study to ensure there is no evidence of obstructive stone.  CT stone is unremarkable.  She was given a dose of Rocephin in the ER as well as Zofran and pain medication.  On reevaluation, she is feeling much better.  She was successfully fluid challenge.  We will discharge the patient with fosfomycin and Zofran.  She remains hemodynamically stable in no acute distress.  ER return precautions given.  She is safe for discharge to home at this time.  Final Clinical Impressions(s) / ED Diagnoses   Final diagnoses:  Acute cystitis without hematuria    ED Discharge Orders         Ordered    fosfomycin (MONUROL) 3 g PACK     07/14/19 0706    ondansetron (ZOFRAN ODT) 4 MG disintegrating tablet  Every 8 hours PRN     07/14/19 0706           Joanne Gavel, PA-C 07/14/19 0739    Palumbo, April, MD 07/14/19 2318

## 2019-07-15 LAB — URINE CULTURE: Special Requests: NORMAL

## 2020-08-15 DIAGNOSIS — K760 Fatty (change of) liver, not elsewhere classified: Secondary | ICD-10-CM

## 2020-08-15 DIAGNOSIS — K8020 Calculus of gallbladder without cholecystitis without obstruction: Secondary | ICD-10-CM

## 2020-08-15 DIAGNOSIS — K851 Biliary acute pancreatitis without necrosis or infection: Secondary | ICD-10-CM | POA: Insufficient documentation

## 2020-08-15 HISTORY — DX: Biliary acute pancreatitis without necrosis or infection: K85.10

## 2020-08-15 HISTORY — DX: Calculus of gallbladder without cholecystitis without obstruction: K80.20

## 2020-08-15 HISTORY — DX: Fatty (change of) liver, not elsewhere classified: K76.0

## 2020-08-18 DIAGNOSIS — N390 Urinary tract infection, site not specified: Secondary | ICD-10-CM

## 2020-08-18 HISTORY — DX: Urinary tract infection, site not specified: N39.0

## 2020-08-26 ENCOUNTER — Encounter (HOSPITAL_COMMUNITY): Payer: Self-pay

## 2020-08-26 ENCOUNTER — Other Ambulatory Visit: Payer: Self-pay

## 2020-08-26 ENCOUNTER — Emergency Department (HOSPITAL_COMMUNITY): Payer: Self-pay

## 2020-08-26 ENCOUNTER — Inpatient Hospital Stay (HOSPITAL_COMMUNITY)
Admission: EM | Admit: 2020-08-26 | Discharge: 2020-08-29 | DRG: 438 | Disposition: A | Discharge status: Home / Self Care | Payer: Self-pay | Attending: Family Medicine | Admitting: Family Medicine

## 2020-08-26 DIAGNOSIS — E876 Hypokalemia: Secondary | ICD-10-CM | POA: Diagnosis present

## 2020-08-26 DIAGNOSIS — U071 COVID-19: Secondary | ICD-10-CM

## 2020-08-26 DIAGNOSIS — Z6832 Body mass index (BMI) 32.0-32.9, adult: Secondary | ICD-10-CM

## 2020-08-26 DIAGNOSIS — K8070 Calculus of gallbladder and bile duct without cholecystitis without obstruction: Secondary | ICD-10-CM | POA: Diagnosis present

## 2020-08-26 DIAGNOSIS — K859 Acute pancreatitis without necrosis or infection, unspecified: Principal | ICD-10-CM

## 2020-08-26 LAB — URINALYSIS, ROUTINE W REFLEX MICROSCOPIC
Bilirubin Urine: NEGATIVE
Glucose, UA: NEGATIVE mg/dL
Ketones, ur: NEGATIVE mg/dL
Nitrite: NEGATIVE
Protein, ur: NEGATIVE mg/dL
Specific Gravity, Urine: 1.003 — ABNORMAL LOW (ref 1.005–1.030)
pH: 7 (ref 5.0–8.0)

## 2020-08-26 LAB — COMPREHENSIVE METABOLIC PANEL
ALT: 68 U/L — ABNORMAL HIGH (ref 0–44)
AST: 43 U/L — ABNORMAL HIGH (ref 15–41)
Albumin: 4.7 g/dL (ref 3.5–5.0)
Alkaline Phosphatase: 69 U/L (ref 38–126)
Anion gap: 10 (ref 5–15)
BUN: 12 mg/dL (ref 6–20)
CO2: 25 mmol/L (ref 22–32)
Calcium: 9.5 mg/dL (ref 8.9–10.3)
Chloride: 103 mmol/L (ref 98–111)
Creatinine, Ser: 0.71 mg/dL (ref 0.44–1.00)
GFR, Estimated: >60 mL/min (ref >60)
Glucose, Bld: 102 mg/dL — ABNORMAL HIGH (ref 70–99)
Potassium: 3.4 mmol/L — ABNORMAL LOW (ref 3.5–5.1)
Sodium: 138 mmol/L (ref 135–145)
Total Bilirubin: 0.6 mg/dL (ref 0.3–1.2)
Total Protein: 8.5 g/dL — ABNORMAL HIGH (ref 6.5–8.1)

## 2020-08-26 LAB — CBC
HCT: 43.2 % (ref 36.0–46.0)
Hemoglobin: 14.1 g/dL (ref 12.0–15.0)
MCH: 31.3 pg (ref 26.0–34.0)
MCHC: 32.6 g/dL (ref 30.0–36.0)
MCV: 96 fL (ref 80.0–100.0)
Platelets: 367 10*3/uL (ref 150–400)
RBC: 4.5 MIL/uL (ref 3.87–5.11)
RDW: 13.4 % (ref 11.5–15.5)
WBC: 12.4 10*3/uL — ABNORMAL HIGH (ref 4.0–10.5)
nRBC: 0 % (ref 0.0–0.2)

## 2020-08-26 LAB — RESP PANEL BY RT-PCR (FLU A&B, COVID) ARPGX2
Influenza A by PCR: NEGATIVE
Influenza B by PCR: NEGATIVE
SARS Coronavirus 2 by RT PCR: POSITIVE — AB

## 2020-08-26 LAB — LIPASE, BLOOD: Lipase: 3457 U/L — ABNORMAL HIGH (ref 11–51)

## 2020-08-26 LAB — I-STAT BETA HCG BLOOD, ED (MC, WL, AP ONLY): I-stat hCG, quantitative: <5 m[IU]/mL (ref <5)

## 2020-08-26 MED ORDER — MORPHINE SULFATE (PF) 4 MG/ML IV SOLN: 4.0000 mg | Freq: Once | INTRAVENOUS | Status: DC

## 2020-08-26 MED ORDER — ONDANSETRON HCL 4 MG/2ML IJ SOLN
4.0000 mg | Freq: Four times a day (QID) | INTRAMUSCULAR | Status: DC | PRN
  Administered 2020-08-27 (×2): 4 mg via INTRAVENOUS
  Filled 2020-08-26 (×2): qty 2

## 2020-08-26 MED ORDER — ENOXAPARIN SODIUM 40 MG/0.4ML ~~LOC~~ SOLN
40.0000 mg | SUBCUTANEOUS | Status: DC
  Administered 2020-08-27 – 2020-08-29 (×3): 40 mg via SUBCUTANEOUS
  Filled 2020-08-26 (×3): qty 0.4

## 2020-08-26 MED ORDER — HYDROMORPHONE HCL 1 MG/ML IJ SOLN
1.0000 mg | Freq: Once | INTRAMUSCULAR | Status: AC
  Administered 2020-08-26: 1 mg via INTRAVENOUS
  Filled 2020-08-26: qty 1

## 2020-08-26 MED ORDER — KCL-LACTATED RINGERS-D5W 20 MEQ/L IV SOLN
INTRAVENOUS | Status: DC
  Filled 2020-08-26 (×8): qty 1000

## 2020-08-26 MED ORDER — HYDROMORPHONE HCL 1 MG/ML IJ SOLN
0.5000 mg | INTRAMUSCULAR | Status: DC | PRN
  Administered 2020-08-27 – 2020-08-28 (×4): 1 mg via INTRAVENOUS
  Filled 2020-08-26 (×4): qty 1

## 2020-08-26 MED ORDER — ONDANSETRON HCL 4 MG PO TABS
4.0000 mg | ORAL_TABLET | Freq: Four times a day (QID) | ORAL | Status: DC | PRN
  Filled 2020-08-26: qty 1

## 2020-08-26 MED ORDER — SODIUM CHLORIDE 0.9 % IV BOLUS
1000.0000 mL | Freq: Once | INTRAVENOUS | Status: AC
  Administered 2020-08-26: 1000 mL via INTRAVENOUS

## 2020-08-26 MED ORDER — MORPHINE SULFATE (PF) 4 MG/ML IV SOLN
4.0000 mg | Freq: Once | INTRAVENOUS | Status: AC
  Administered 2020-08-26: 4 mg via INTRAVENOUS
  Filled 2020-08-26: qty 1

## 2020-08-26 MED ORDER — IOHEXOL 300 MG/ML  SOLN
100.0000 mL | Freq: Once | INTRAMUSCULAR | Status: AC | PRN
  Administered 2020-08-26: 100 mL via INTRAVENOUS

## 2020-08-26 MED ORDER — ONDANSETRON HCL 4 MG/2ML IJ SOLN
4.0000 mg | Freq: Once | INTRAMUSCULAR | Status: AC
  Administered 2020-08-26: 4 mg via INTRAVENOUS
  Filled 2020-08-26: qty 2

## 2020-08-26 MED ORDER — POTASSIUM CHLORIDE 2 MEQ/ML IV SOLN: INTRAVENOUS | Status: DC

## 2020-08-26 NOTE — H&P (Signed)
History and Physical   SHEROL SABAS LGX:[id number] DOB: 17-Jan-1981 DOA: 08/26/2020  Referring MD/NP/PA: Dr. Juanetta Gosling  PCP: Patient, No Pcp Per   Outpatient Specialists: None  Patient coming from: Home  Chief Complaint: Abdominal pain  HPI: Kelsey Thomas is a 40 y.o. female with medical history significant of no significant past medical history who is only speaking Spanish therefore requiring interpreter.  Patient apparently has been having this abdominal pain mainly in the epigastric region within the last week.  She went to an outside clinic where she was seen and evaluated.  She was thought to have had a UTI and was given Bactrim for treatment.  She went back again today because the pain has persisted mainly in the epigastric region rated as 10 out of 10.  He has come back to like 6 out of 10.  Associated with nausea but no vomiting.  She came to the ER now where she has been evaluated for further treatment..  ED Course: Temperature 99.2 blood pressure 149/90 pulse 83 respiratory 16 oxygen sat 97% on room air.  Sodium 138 potassium 3.4 chloride 103.  CO2 of 25.  Glucose is 102.  Lipase 3457.  The rest of the chemistry appeared to be within normal.  White count is 12.5.  COVID-19 screen is positive.  Urinalysis shows no significant findings.  CT abdomen pelvis showed no acute findings.  Patient being admitted with acute pancreatitis with probable incidental finding of COVID-19 infection asymptomatic.  Review of Systems: As per HPI otherwise 10 point review of systems negative.    Past Medical History:  Diagnosis Date  . Medical history non-contributory     Past Surgical History:  Procedure Laterality Date  . NO PAST SURGERIES       reports that she has never smoked. She has never used smokeless tobacco. She reports that she does not drink alcohol and does not use drugs.  No Known Allergies  No family history on file.   Prior to Admission medications   Medication Sig  Start Date End Date Taking? Authorizing Provider  fosfomycin (MONUROL) 3 g PACK Take 3 g by mouth then repeat with a second dose in 2 days. 07/14/19   McDonald, Mia A, PA-C  ondansetron (ZOFRAN ODT) 4 MG disintegrating tablet Take 1 tablet (4 mg total) by mouth every 8 (eight) hours as needed. 07/14/19   Barkley Boards, PA-C    Physical Exam: Vitals:   08/26/20 1556 08/26/20 1910 08/26/20 2000  BP: (!) 149/90 129/87 (!) 128/93  Pulse: 72 75 66  Resp: 18 19 19   Temp: 99.2 F (37.3 C) 98.9 F (37.2 C)   TempSrc: Oral Oral   SpO2: 98% 98% 99%      Constitutional: Acutely ill looking no distress, morbidly obese Vitals:   08/26/20 1556 08/26/20 1910 08/26/20 2000  BP: (!) 149/90 129/87 (!) 128/93  Pulse: 72 75 66  Resp: 18 19 19   Temp: 99.2 F (37.3 C) 98.9 F (37.2 C)   TempSrc: Oral Oral   SpO2: 98% 98% 99%   Eyes: PERRL, lids and conjunctivae normal ENMT: Mucous membranes are dry. Posterior pharynx clear of any exudate or lesions.Normal dentition.  Neck: normal, supple, no masses, no thyromegaly Respiratory: clear to auscultation bilaterally, no wheezing, no crackles. Normal respiratory effort. No accessory muscle use.  Cardiovascular: Regular rate and rhythm, no murmurs / rubs / gallops. No extremity edema. 2+ pedal pulses. No carotid bruits.  Abdomen: Diffuse epigastric tenderness,, no masses palpated.  No hepatosplenomegaly. Bowel sounds positive.  Musculoskeletal: no clubbing / cyanosis. No joint deformity upper and lower extremities. Good ROM, no contractures. Normal muscle tone.  Skin: no rashes, lesions, ulcers. No induration Neurologic: CN 2-12 grossly intact. Sensation intact, DTR normal. Strength 5/5 in all 4.  Psychiatric: Normal judgment and insight. Alert and oriented x 3. Normal mood.     Labs on Admission: I have personally reviewed following labs and imaging studies  CBC: Recent Labs  Lab 08/26/20 1618  WBC 12.4*  HGB 14.1  HCT 43.2  MCV 96.0  PLT  367   Basic Metabolic Panel: Recent Labs  Lab 08/26/20 1618  NA 138  K 3.4*  CL 103  CO2 25  GLUCOSE 102*  BUN 12  CREATININE 0.71  CALCIUM 9.5   GFR: CrCl cannot be calculated (Unknown ideal weight.). Liver Function Tests: Recent Labs  Lab 08/26/20 1618  AST 43*  ALT 68*  ALKPHOS 69  BILITOT 0.6  PROT 8.5*  ALBUMIN 4.7   Recent Labs  Lab 08/26/20 1618  LIPASE 3,457*   No results for input(s): AMMONIA in the last 168 hours. Coagulation Profile: No results for input(s): INR, PROTIME in the last 168 hours. Cardiac Enzymes: No results for input(s): CKTOTAL, CKMB, CKMBINDEX, TROPONINI in the last 168 hours. BNP (last 3 results) No results for input(s): PROBNP in the last 8760 hours. HbA1C: No results for input(s): HGBA1C in the last 72 hours. CBG: No results for input(s): GLUCAP in the last 168 hours. Lipid Profile: No results for input(s): CHOL, HDL, LDLCALC, TRIG, CHOLHDL, LDLDIRECT in the last 72 hours. Thyroid Function Tests: No results for input(s): TSH, T4TOTAL, FREET4, T3FREE, THYROIDAB in the last 72 hours. Anemia Panel: No results for input(s): VITAMINB12, FOLATE, FERRITIN, TIBC, IRON, RETICCTPCT in the last 72 hours. Urine analysis:    Component Value Date/Time   COLORURINE STRAW (A) 08/26/2020 1619   APPEARANCEUR CLEAR 08/26/2020 1619   LABSPEC 1.003 (L) 08/26/2020 1619   PHURINE 7.0 08/26/2020 1619   GLUCOSEU NEGATIVE 08/26/2020 1619   HGBUR LARGE (A) 08/26/2020 1619   BILIRUBINUR NEGATIVE 08/26/2020 1619   KETONESUR NEGATIVE 08/26/2020 1619   PROTEINUR NEGATIVE 08/26/2020 1619   UROBILINOGEN 0.2 04/23/2012 2105   NITRITE NEGATIVE 08/26/2020 1619   LEUKOCYTESUR TRACE (A) 08/26/2020 1619   Sepsis Labs: @LABRCNTIP (procalcitonin:4,lacticidven:4) ) Recent Results (from the past 240 hour(s))  Resp Panel by RT-PCR (Flu A&B, Covid) Nasopharyngeal Swab     Status: Abnormal   Collection Time: 08/26/20  6:48 PM   Specimen: Nasopharyngeal Swab;  Nasopharyngeal(NP) swabs in vial transport medium  Result Value Ref Range Status   SARS Coronavirus 2 by RT PCR POSITIVE (A) NEGATIVE Final    Comment: RESULT CALLED TO, READ BACK BY AND VERIFIED WITH: SMITH J. 01.12.22 @ 2021 BY MECIAL J. (NOTE) SARS-CoV-2 target nucleic acids are DETECTED.  The SARS-CoV-2 RNA is generally detectable in upper respiratory specimens during the acute phase of infection. Positive results are indicative of the presence of the identified virus, but do not rule out bacterial infection or co-infection with other pathogens not detected by the test. Clinical correlation with patient history and other diagnostic information is necessary to determine patient infection status. The expected result is Negative.  Fact Sheet for Patients: 2022  Fact Sheet for Healthcare Providers: BloggerCourse.com  This test is not yet approved or cleared by the [employer] FDA and  has been authorized for detection and/or diagnosis of SARS-CoV-2 by FDA under an Emergency Use Authorization (  EUA).  This EUA will remain in effect (meaning this test ca n be used) for the duration of  the COVID-19 declaration under Section 564(b)(1) of the Act, 21 U.S.C. section 360bbb-3(b)(1), unless the authorization is terminated or revoked sooner.     Influenza A by PCR NEGATIVE NEGATIVE Final   Influenza B by PCR NEGATIVE NEGATIVE Final    Comment: (NOTE) The Xpert Xpress SARS-CoV-2/FLU/RSV plus assay is intended as an aid in the diagnosis of influenza from Nasopharyngeal swab specimens and should not be used as a sole basis for treatment. Nasal washings and aspirates are unacceptable for Xpert Xpress SARS-CoV-2/FLU/RSV testing.  Fact Sheet for Patients: BloggerCourse.com  Fact Sheet for Healthcare Providers: SeriousBroker.it  This test is not yet approved or cleared by the  [place] FDA and has been authorized for detection and/or diagnosis of SARS-CoV-2 by FDA under an Emergency Use Authorization (EUA). This EUA will remain in effect (meaning this test can be used) for the duration of the COVID-19 declaration under Section 564(b)(1) of the Act, 21 U.S.C. section 360bbb-3(b)(1), unless the authorization is terminated or revoked.  Performed at [facility], [address], [place], [place] [address]      Radiological Exams on Admission: CT ABDOMEN PELVIS W CONTRAST  Result Date: 08/26/2020 CLINICAL DATA:  Abdominal pain EXAM: CT ABDOMEN AND PELVIS WITH CONTRAST TECHNIQUE: Multidetector CT imaging of the abdomen and pelvis was performed using the standard protocol following bolus administration of intravenous contrast. CONTRAST:  OMNIPAQUE IOHEXOL 300 MG/ML  SOLN COMPARISON:  Noncontrast study 07/14/2019 FINDINGS: Lower chest: No acute abnormality. Hepatobiliary: No focal liver abnormality. Gallbladder is unremarkable. No biliary dilatation. Pancreas: Unremarkable. Spleen: Unremarkable. Adrenals/Urinary Tract: Adrenals, kidneys, and partially distended bladder are unremarkable. Stomach/Bowel: Stomach is within limits. Bowel is in caliber. Probable small diverticulum of the duodenum. Bowel is normal in caliber. Normal appendix. Vascular/Lymphatic: No significant vascular abnormality. No enlarged lymph nodes. Reproductive: Intrauterine device is present.  No adnexal mass. Other: No ascites.  Abdominal wall is unremarkable. Musculoskeletal: No acute osseous abnormality. IMPRESSION: No acute abnormality. Electronically Signed   By: Guadlupe Spanish M.D.   On: 08/26/2020 20:50      Assessment/Plan Active Problems:   COVID-19 virus infection   Hypokalemia   Acute pancreatitis     #1  Acute pancreatitis: Cause is not entirely clear.  Could be hyperlipidemia especially triglycerides.  It could also be secondary to acute COVID infection.   Could be gallstones.  Patient will be admitted.  No evidence of gallbladder disease or gallstones on CT.  We'll get a right upper quad abdominal ultrasound, also get fasting lipid panel.  Complete n.p.o. with pain management and IV fluids.  Follow daily lipase  #2  COVID-19 infection: Appears asymptomatic at the moment.  We will not initiate COVID-specific medications for now.  We will hydrate the patient and monitor.  #3 hypokalemia: Replete potassium.  #4 morbid obesity: Dietary counseling   DVT prophylaxis: Lovenox Code Status: Full code Family Communication: Family in place Disposition Plan: Home Consults called: None Admission status: Inpatient  Severity of Illness: The appropriate patient status for this patient is INPATIENT. Inpatient status is judged to be reasonable and necessary in order to provide the required intensity of service to ensure the patient's safety. The patient's presenting symptoms, physical exam findings, and initial radiographic and laboratory data in the context of their chronic comorbidities is felt to place them at high risk for further clinical deterioration. Furthermore, it is not  anticipated that the patient will be medically stable for discharge from the hospital within 2 midnights of admission. The following factors support the patient status of inpatient.   " The patient's presenting symptoms include epigastric pain. " The worrisome physical exam findings include epigastric tenderness. " The initial radiographic and laboratory data are worrisome because of lipase of more than 3000. " The chronic co-morbidities include none.   * I certify that at the point of admission it is my clinical judgment that the patient will require inpatient hospital care spanning beyond 2 midnights from the point of admission due to high intensity of service, high risk for further deterioration and high frequency of surveillance required.Lonia Blood*    Arav Bannister,LAWAL MD [facility] Pager [phone]  If 7PM-7AM, please contact night-coverage [email or web address] Password [email or web address]  08/26/2020, 9:53 PM

## 2020-08-26 NOTE — ED Notes (Signed)
Report called to Renee RN.

## 2020-08-26 NOTE — ED Triage Notes (Signed)
Pt coming in from clinic c/o abdominal pain and back pain. Went to clinic 8 days ago and told she has UTI. Pain has not gone away and clinic told her to come in. Reports N/V yesterday.  Kelsey Thomas #[id number]

## 2020-08-26 NOTE — ED Provider Notes (Signed)
[facility]-EMERGENCY DEPT Provider Note   CSN: [id number] Arrival date & time: 08/26/20  1539     History Chief Complaint  Patient presents with  . Abdominal Pain    Kelsey Thomas is a 40 y.o. female who presented with epigastric pain.  Patient has been having epigastric pain for the last month or so.  Patient was seen at a clinic and was prescribed antibiotics for UTI.  Patient had persistent pain and went to another clinic and was given 3 days of Bactrim for UTI.  Patient went back to the clinic today because of persistent pain.  Patient states that the pain is epigastric area and is crampy and constant.  Associated with some vomiting yesterday.  Patient was sent here for CT to rule out intra-abdominal pathology.  Patient states that she had no previous abdominal surgeries.  States that she is fully vaccinated against COVID.  Denies any COVID exposure  The history is provided by the patient. A language interpreter was used.       Past Medical History:  Diagnosis Date  . Medical history non-contributory     Patient Active Problem List   Diagnosis Date Noted  . Active labor at term 09/25/2014    Past Surgical History:  Procedure Laterality Date  . NO PAST SURGERIES       OB History    Gravida  3   Para  3   Term  3   Preterm  0   AB  0   Living  3     SAB  0   IAB  0   Ectopic  0   Multiple  0   Live Births  3           No family history on file.  Social History   Tobacco Use  . Smoking status: Never Smoker  . Smokeless tobacco: Never Used  Substance Use Topics  . Alcohol use: No  . Drug use: No    Home Medications Prior to Admission medications   Medication Sig Start Date End Date Taking? Authorizing Provider  fosfomycin (MONUROL) 3 g PACK Take 3 g by mouth then repeat with a second dose in 2 days. 07/14/19   McDonald, Mia A, PA-C  ondansetron (ZOFRAN ODT) 4 MG disintegrating tablet Take 1 tablet (4 mg total)  by mouth every 8 (eight) hours as needed. 07/14/19   McDonald, Mia A, PA-C    Allergies    Patient has no known allergies.  Review of Systems   Review of Systems  Gastrointestinal: Positive for abdominal pain and vomiting.  All other systems reviewed and are negative.   Physical Exam Updated Vital Signs BP (!) 149/90   Pulse 72   Temp 99.2 F (37.3 C) (Oral)   Resp 18   LMP 08/19/2020   SpO2 98%   Physical Exam Vitals and nursing note reviewed.  Constitutional:      Comments: Uncomfortable   HENT:     Head: Normocephalic.     Mouth/Throat:     Mouth: Mucous membranes are moist.  Eyes:     Extraocular Movements: Extraocular movements intact.  Cardiovascular:     Rate and Rhythm: Normal rate and regular rhythm.     Heart sounds: Normal heart sounds.  Pulmonary:     Effort: Pulmonary effort is normal.     Breath sounds: Normal breath sounds.  Abdominal:     General: Abdomen is flat.     Comments: +  epigastric tenderness with mild guarding   Skin:    General: Skin is warm.     Capillary Refill: Capillary refill takes less than 2 seconds.  Neurological:     General: No focal deficit present.     Mental Status: She is oriented to person, place, and time.  Psychiatric:        Mood and Affect: Mood normal.        Behavior: Behavior normal.     ED Results / Procedures / Treatments   Labs (all labs ordered are listed, but only abnormal results are displayed) Labs Reviewed  LIPASE, BLOOD - Abnormal; Notable for the following components:      Result Value   Lipase 3,457 (*)    All other components within normal limits  COMPREHENSIVE METABOLIC PANEL - Abnormal; Notable for the following components:   Potassium 3.4 (*)    Glucose, Bld 102 (*)    Total Protein 8.5 (*)    AST 43 (*)    ALT 68 (*)    All other components within normal limits  CBC - Abnormal; Notable for the following components:   WBC 12.4 (*)    All other components within normal limits   URINALYSIS, ROUTINE W REFLEX MICROSCOPIC - Abnormal; Notable for the following components:   Color, Urine STRAW (*)    Specific Gravity, Urine 1.003 (*)    Hgb urine dipstick LARGE (*)    Leukocytes,Ua TRACE (*)    Bacteria, UA RARE (*)    All other components within normal limits  RESP PANEL BY RT-PCR (FLU A&B, COVID) ARPGX2  I-STAT BETA HCG BLOOD, ED ([facility], [facility], [facility] ONLY)    EKG None  Radiology No results found.  Procedures Procedures (including critical care time)  CRITICAL CARE Performed by: Richardean Canal   Total critical care time: 30 minutes  Critical care time was exclusive of separately billable procedures and treating other patients.  Critical care was necessary to treat or prevent imminent or life-threatening deterioration.  Critical care was time spent personally by me on the following activities: development of treatment plan with patient and/or surrogate as well as nursing, discussions with consultants, evaluation of patient's response to treatment, examination of patient, obtaining history from patient or surrogate, ordering and performing treatments and interventions, ordering and review of laboratory studies, ordering and review of radiographic studies, pulse oximetry and re-evaluation of patient's condition.   Medications Ordered in ED Medications  sodium chloride 0.9 % bolus 1,000 mL (1,000 mLs Intravenous New Bag/Given 08/26/20 1846)  ondansetron (ZOFRAN) injection 4 mg (4 mg Intravenous Given 08/26/20 1847)  morphine 4 MG/ML injection 4 mg (4 mg Intravenous Given 08/26/20 1851)    ED Course  I have reviewed the triage vital signs and the nursing notes.  Pertinent labs & imaging results that were available during my care of the patient were reviewed by me and considered in my medical decision making (see chart for details).    MDM Rules/Calculators/A&P                         Kelsey Thomas is a 40 y.o. female who presented with epigastric pain.   Consider biliary colic versus pancreatitis, less likely pyelonephritis.  Plan to get CBC and CMP and lipase and CT abdomen pelvis and urinalysis.  Will hydrate and give pain meds and reassess  9:48 PM Patient labs showed lipase of 3500.  Incidentally patient's COVID is positive.  However  she has no respiratory symptoms.  We will hold off on steroids and remdesivir. She is also fully vaccinated.  She was given multiple rounds of pain medicine and IV fluids.  Will admit for pancreatitis.  Final Clinical Impression(s) / ED Diagnoses Final diagnoses:  None    Rx / DC Orders ED Discharge Orders    None       Charlynne Pander, MD 08/26/20 2148

## 2020-08-26 NOTE — ED Notes (Signed)
Pt ambulatory from triage to room 

## 2020-08-27 ENCOUNTER — Encounter (HOSPITAL_COMMUNITY): Payer: Self-pay | Admitting: Internal Medicine

## 2020-08-27 ENCOUNTER — Inpatient Hospital Stay (HOSPITAL_COMMUNITY): Payer: Self-pay

## 2020-08-27 LAB — COMPREHENSIVE METABOLIC PANEL
ALT: 52 U/L — ABNORMAL HIGH (ref 0–44)
AST: 31 U/L (ref 15–41)
Albumin: 3.7 g/dL (ref 3.5–5.0)
Alkaline Phosphatase: 51 U/L (ref 38–126)
Anion gap: 7 (ref 5–15)
BUN: 9 mg/dL (ref 6–20)
CO2: 23 mmol/L (ref 22–32)
Calcium: 8.8 mg/dL — ABNORMAL LOW (ref 8.9–10.3)
Chloride: 108 mmol/L (ref 98–111)
Creatinine, Ser: 0.72 mg/dL (ref 0.44–1.00)
GFR, Estimated: >60 mL/min (ref >60)
Glucose, Bld: 131 mg/dL — ABNORMAL HIGH (ref 70–99)
Potassium: 3.8 mmol/L (ref 3.5–5.1)
Sodium: 138 mmol/L (ref 135–145)
Total Bilirubin: 0.4 mg/dL (ref 0.3–1.2)
Total Protein: 6.8 g/dL (ref 6.5–8.1)

## 2020-08-27 LAB — CBC
HCT: 38.8 % (ref 36.0–46.0)
HCT: 38.9 % (ref 36.0–46.0)
Hemoglobin: 12.5 g/dL (ref 12.0–15.0)
Hemoglobin: 12.8 g/dL (ref 12.0–15.0)
MCH: 31 pg (ref 26.0–34.0)
MCH: 31.5 pg (ref 26.0–34.0)
MCHC: 32.1 g/dL (ref 30.0–36.0)
MCHC: 33 g/dL (ref 30.0–36.0)
MCV: 95.6 fL (ref 80.0–100.0)
MCV: 96.5 fL (ref 80.0–100.0)
Platelets: 335 10*3/uL (ref 150–400)
Platelets: 347 10*3/uL (ref 150–400)
RBC: 4.03 MIL/uL (ref 3.87–5.11)
RBC: 4.06 MIL/uL (ref 3.87–5.11)
RDW: 13.4 % (ref 11.5–15.5)
RDW: 13.5 % (ref 11.5–15.5)
WBC: 8.8 10*3/uL (ref 4.0–10.5)
WBC: 9.5 10*3/uL (ref 4.0–10.5)
nRBC: 0 % (ref 0.0–0.2)
nRBC: 0 % (ref 0.0–0.2)

## 2020-08-27 LAB — LIPID PANEL
Cholesterol: 146 mg/dL (ref 0–200)
HDL: 39 mg/dL — ABNORMAL LOW (ref >40)
LDL Cholesterol: 93 mg/dL (ref 0–99)
Total CHOL/HDL Ratio: 3.7 RATIO
Triglycerides: 71 mg/dL (ref <150)
VLDL: 14 mg/dL (ref 0–40)

## 2020-08-27 LAB — CREATININE, SERUM
Creatinine, Ser: 0.68 mg/dL (ref 0.44–1.00)
GFR, Estimated: >60 mL/min (ref >60)

## 2020-08-27 LAB — LIPASE, BLOOD: Lipase: 1335 U/L — ABNORMAL HIGH (ref 11–51)

## 2020-08-27 LAB — HIV ANTIBODY (ROUTINE TESTING W REFLEX): HIV Screen 4th Generation wRfx: NONREACTIVE

## 2020-08-27 NOTE — Progress Notes (Signed)
PROGRESS NOTE   Kelsey Thomas  NTI:[id number] DOB: 08-Jun-1981 DOA: 08/26/2020 PCP: Patient, No Pcp Per  Brief Narrative:  40 year old Hispanic female grand multiparity Scant medical history other than BMI 32 Developed epigastric pain within the last week when she was seen at a clinic-Rx UTI Bactrim-pain now becoming 10/10 CT = acute pancreatitis-lipase 3457, incidentally COVID-19   Assessment & Plan:   Principal Problem:   Acute pancreatitis Active Problems:   COVID-19 virus infection   Hypokalemia   1. Acute pancreatitis a. Likely choledocholithiasis b. Deferred to general surgery neck steps and planning-I printed out some paperwork for her to understand what the gallbladder issue is and the connection with the pancreas and general surgery will talk to her later today regarding the same c. Graduated to full liquid diet and see how she manages d. Tolerating pain meds and pain is improved 2. Coincidental COVID-positive asymptomatic a. No oxygen requirement-monitor trends- 3. BMI 32 4. Grand multiparity  DVT prophylaxis: Lovenox Code Status: Full Family Communication: None Disposition:  Status is: Inpatient pending input from surgery  Remains inpatient appropriate because:Persistent severe electrolyte disturbances, Ongoing active pain requiring inpatient pain management and Unsafe d/c plan   Dispo: The patient is from: Home              Anticipated d/c is to: Home              Anticipated d/c date is: 2 days              Patient currently is not medically stable to d/c.       Consultants:   General surgery  Procedures: None  Antimicrobials: None currently   Subjective: Awake pleasant confused with interpreter Tells me pain is better after getting IV medication No chest pain no fever no chills   Objective: Vitals:   08/26/20 2200 08/26/20 2242 08/27/20 0247 08/27/20 0632  BP: (!) 125/91 122/82 114/77 121/71  Pulse: 83 82 81 63  Resp: 16 18 18 18    Temp:  98.3 F (36.8 C) 98 F (36.7 C) (!) 97.5 F (36.4 C)  TempSrc:  Oral Oral Oral  SpO2: 97% 98% 97% 99%  Weight:  84 kg    Height:  5\' 3"  (1.6 m)      Intake/Output Summary (Last 24 hours) at 08/27/2020 0743 Last data filed at 08/27/2020 0600 Gross per 24 hour  Intake 1818.07 ml  Output --  Net 1818.07 ml   Filed Weights   08/26/20 2242  Weight: 84 kg    Examination:  EOMI NCAT obese S1-S2 no murmurs No epigastric tenderness no rebound no guarding abdomen patulous and obese but no specific guarding as above No lower extremity edema Chest clear  Data Reviewed: I have personally reviewed following labs and imaging studies  Sodium 138 potassium 3.8 BUN/creatinine 9/0.7 WBC 9.5 Lipase 3457-->1335 Triglycerides 71 Ultrasound abdomen pelvis shows cholelithiasis without signs of cholecystitis  COVID-19 Labs  No results for input(s): DDIMER, FERRITIN, LDH, CRP in the last 72 hours.  Lab Results  Component Value Date   SARSCOV2NAA POSITIVE (A) 08/26/2020     Radiology Studies: CT ABDOMEN PELVIS W CONTRAST  Result Date: 08/26/2020 CLINICAL DATA:  Abdominal pain EXAM: CT ABDOMEN AND PELVIS WITH CONTRAST TECHNIQUE: Multidetector CT imaging of the abdomen and pelvis was performed using the standard protocol following bolus administration of intravenous contrast. CONTRAST:  10/24/2020 OMNIPAQUE IOHEXOL 300 MG/ML  SOLN COMPARISON:  Noncontrast study 07/14/2019 FINDINGS: Lower chest: No acute abnormality. Hepatobiliary:  No focal liver abnormality. Gallbladder is unremarkable. No biliary dilatation. Pancreas: Unremarkable. Spleen: Unremarkable. Adrenals/Urinary Tract: Adrenals, kidneys, and partially distended bladder are unremarkable. Stomach/Bowel: Stomach is within limits. Bowel is in caliber. Probable small diverticulum of the duodenum. Bowel is normal in caliber. Normal appendix. Vascular/Lymphatic: No significant vascular abnormality. No enlarged lymph nodes. Reproductive:  Intrauterine device is present.  No adnexal mass. Other: No ascites.  Abdominal wall is unremarkable. Musculoskeletal: No acute osseous abnormality. IMPRESSION: No acute abnormality. Electronically Signed   By: Guadlupe Spanish M.D.   On: 08/26/2020 20:50   US Abdomen Limited RUQ (LIVER/GB)  Result Date: 08/27/2020 CLINICAL DATA:  Pancreatitis EXAM: ULTRASOUND ABDOMEN LIMITED RIGHT UPPER QUADRANT COMPARISON:  CT from same day FINDINGS: Gallbladder: Gallstones are noted without evidence for gallbladder wall thickening or pericholecystic free fluid. The sonographic Eulah Pont sign is negative. Common bile duct: Diameter: 4 mm Liver: There is hepatic steatosis. There is a hypoechoic area adjacent to the gallbladder fossa that is geographic in distribution and is favored to represent an area of focal fatty sparing. Portal vein is patent on color Doppler imaging with normal direction of blood flow towards the liver. Other: None. IMPRESSION: 1. There is cholelithiasis without secondary signs of acute cholecystitis. 2. Hepatic steatosis. Electronically Signed   By: Katherine Mantle M.D.   On: 08/27/2020 01:28     Scheduled Meds: . enoxaparin (LOVENOX) injection  40 mg Subcutaneous Q24H   Continuous Infusions: . dextrose 5% lactated ringers with KCl 20 mEq/L 125 mL/hr at 08/26/20 2324     LOS: 1 day    Time spent: 17  Rhetta Mura, MD [facility] To contact the attending provider between 7A-7P or the covering provider during after hours 7P-7A, please log into the web site [email or web address] and access using universal [employer] password for that web site. If you do not have the password, please call the hospital operator.  08/27/2020, 7:43 AM

## 2020-08-27 NOTE — Consult Note (Signed)
[facility] Consult Note  Kelsey Thomas 06-01-1981  [id number].    Requesting MD: Verneita Griffes Chief Complaint/Reason for Consult: pancreatitis   HPI:  Kelsey Thomas is a 40yo female with no significant PMH who was admitted to [facility] yesterday with 1 week of abdominal pain. She was seen in an outside clinic and thought to have a UTI and therefore started on bactrim. She notes worsening pain that increased to a 10/10 pain in her epigastrium with radiation to her back on Monday. Pain is crampy in nature, constant but waxes and wanes in severity. Associated with nausea and 1 episode of emesis yesterday. She presented to the ED yesterday for evaluation.   In the ED lab work was significant for WBC 12.4, mild transaminitis AST 43, ALT 68, Alk phos 69, Tbili 0.6, lipase 3457, triglycerides 71. Abdominal u/s shows gallstones without signs of cholecystitis. Incidentally COVID+.  General surgery asked to see for consideration of cholecystectomy. Her pain has improved since admission.   Abdominal surgical history: none Anticoagulants: none Nonsmoker Denies alcohol use Employment: [employer] Vaccinated against covid  Review of Systems  Constitutional: Positive for fever. Negative for chills.  Respiratory: Negative for cough and shortness of breath.   Cardiovascular: Positive for chest pain.  Gastrointestinal: Positive for abdominal pain, nausea and vomiting.  Genitourinary: Negative for dysuria.  Psychiatric/Behavioral: Negative for substance abuse.  All other systems reviewed and are negative.  All systems reviewed and otherwise negative except for as above  No family history on file.  Past Medical History:  Diagnosis Date  . Medical history non-contributory   . UTI (urinary tract infection) 08/18/2020    Past Surgical History:  Procedure Laterality Date  . NO PAST SURGERIES      Social History:  reports that she has never smoked. She has never used smokeless  tobacco. She reports that she does not drink alcohol and does not use drugs.  Allergies: No Known Allergies  Medications Prior to Admission  Medication Sig Dispense Refill  . ibuprofen (ADVIL) 200 MG tablet Take 600-800 mg by mouth every 6 (six) hours as needed for mild pain.      Prior to Admission medications   Medication Sig Start Date End Date Taking? Authorizing Provider  ibuprofen (ADVIL) 200 MG tablet Take 600-800 mg by mouth every 6 (six) hours as needed for mild pain.   Yes [provider]    Blood pressure 119/85, pulse 66, temperature 98.6 F (37 C), temperature source Oral, resp. rate 18, height _0  (1.6 m), weight 84 kg, last menstrual period 08/19/2020, SpO2 99 %, unknown if currently breastfeeding. Physical Exam: General: pleasant, WD/WN female who is laying in bed in NAD HEENT: head is normocephalic, atraumatic.  Sclera are noninjected.  PERRL.  Ears and nose without any masses or lesions.  Mouth is pink and moist. Dentition fair Heart: regular, rate, and rhythm.  Normal s1,s2. No obvious murmurs, gallops, or rubs noted.  Palpable pedal pulses bilaterally  Lungs: CTAB, no wheezes, rhonchi, or rales noted.  Respiratory effort nonlabored Abd: Soft, ND, mild to moderate tenderness of the epigastrium without rebound rigidity or guarding. +BS, no masses, hernias, or organomegaly MS: no BUE/BLE edema, calves soft and nontender Skin: warm and dry with no masses, lesions, or rashes Psych: A&Ox4 with an appropriate affect Neuro: cranial nerves grossly intact, equal strength in BUE/BLE bilaterally, normal speech, thought process intact  Results for orders placed or performed during the hospital encounter of 08/26/20 (from the past  48 hour(s))  Lipase, blood     Status: Abnormal   Collection Time: 08/26/20  4:18 PM  Result Value Ref Range   Lipase 3,457 (H) 11 - 51 U/L    Comment: RESULTS CONFIRMED BY MANUAL DILUTION Performed at [facility], [place] [address] [place], [place] [address]   Comprehensive metabolic panel     Status: Abnormal   Collection Time: 08/26/20  4:18 PM  Result Value Ref Range   Sodium 138 135 - 145 mmol/L   Potassium 3.4 (L) 3.5 - 5.1 mmol/L   Chloride 103 98 - 111 mmol/L   CO2 25 22 - 32 mmol/L   Glucose, Bld 102 (H) 70 - 99 mg/dL    Comment: Glucose reference range applies only to samples taken after fasting for at least 8 hours.   BUN 12 6 - 20 mg/dL   Creatinine, Ser 0.71 0.44 - 1.00 mg/dL   Calcium 9.5 8.9 - 10.3 mg/dL   Total Protein 8.5 (H) 6.5 - 8.1 g/dL   Albumin 4.7 3.5 - 5.0 g/dL   AST 43 (H) 15 - 41 U/L   ALT 68 (H) 0 - 44 U/L   Alkaline Phosphatase 69 38 - 126 U/L   Total Bilirubin 0.6 0.3 - 1.2 mg/dL   GFR, Estimated >60 >60 mL/min    Comment: (NOTE) Calculated using the CKD-EPI Creatinine Equation (2021)    Anion gap 10 5 - 15    Comment: Performed at [facility], [place] [address], [place], [place] [address]  CBC     Status: Abnormal   Collection Time: 08/26/20  4:18 PM  Result Value Ref Range   WBC 12.4 (H) 4.0 - 10.5 K/uL   RBC 4.50 3.87 - 5.11 MIL/uL   Hemoglobin 14.1 12.0 - 15.0 g/dL   HCT 43.2 36.0 - 46.0 %   MCV 96.0 80.0 - 100.0 fL   MCH 31.3 26.0 - 34.0 pg   MCHC 32.6 30.0 - 36.0 g/dL   RDW 13.4 11.5 - 15.5 %   Platelets 367 150 - 400 K/uL   nRBC 0.0 0.0 - 0.2 %    Comment: Performed at [facility], [place] [address] [place], [place] [address]  Urinalysis, Routine w reflex microscopic Urine, Clean Catch     Status: Abnormal   Collection Time: 08/26/20  4:19 PM  Result Value Ref Range   Color, Urine STRAW (A) YELLOW   APPearance CLEAR CLEAR   Specific Gravity, Urine 1.003 (L) 1.005 - 1.030   pH 7.0 5.0 - 8.0   Glucose, UA NEGATIVE NEGATIVE mg/dL   Hgb urine dipstick LARGE (A) NEGATIVE   Bilirubin Urine NEGATIVE NEGATIVE   Ketones, ur NEGATIVE NEGATIVE mg/dL   Protein, ur NEGATIVE NEGATIVE mg/dL   Nitrite NEGATIVE NEGATIVE    Leukocytes,Ua TRACE (A) NEGATIVE   RBC / HPF 0-5 0 - 5 RBC/hpf   WBC, UA 0-5 0 - 5 WBC/hpf   Bacteria, UA RARE (A) NONE SEEN   Squamous Epithelial / LPF 0-5 0 - 5   Mucus PRESENT     Comment: Performed at [facility], [place] [address] [place], [place] [address]  I-Stat beta hCG blood, ED     Status: None   Collection Time: 08/26/20  4:25 PM  Result Value Ref Range   I-stat hCG, quantitative <5.0 <5 mIU/mL   Comment 3            Comment:   GEST.  AGE      CONC.  (mIU/mL)   <=1 WEEK        5 - 50     2 WEEKS       50 - 500     3 WEEKS       100 - 10,000     4 WEEKS     1,000 - 30,000        FEMALE AND NON-PREGNANT FEMALE:     LESS THAN 5 mIU/mL   Resp Panel by RT-PCR (Flu A&B, Covid) Nasopharyngeal Swab     Status: Abnormal   Collection Time: 08/26/20  6:48 PM   Specimen: Nasopharyngeal Swab; Nasopharyngeal(NP) swabs in vial transport medium  Result Value Ref Range   SARS Coronavirus 2 by RT PCR POSITIVE (A) NEGATIVE    Comment: RESULT CALLED TO, READ BACK BY AND VERIFIED WITH: SMITH J. 01.12.22 @ 2021 BY MECIAL J. (NOTE) SARS-CoV-2 target nucleic acids are DETECTED.  The SARS-CoV-2 RNA is generally detectable in upper respiratory specimens during the acute phase of infection. Positive results are indicative of the presence of the identified virus, but do not rule out bacterial infection or co-infection with other pathogens not detected by the test. Clinical correlation with patient history and other diagnostic information is necessary to determine patient infection status. The expected result is Negative.  Fact Sheet for Patients: EntrepreneurPulse.com.au  Fact Sheet for Healthcare Providers: IncredibleEmployment.be  This test is not yet approved or cleared by the [place] FDA and  has been authorized for detection and/or diagnosis of SARS-CoV-2 by FDA under an Emergency Use Authorization (EUA).  This EUA  will remain in effect (meaning this test ca n be used) for the duration of  the COVID-19 declaration under Section 564(b)(1) of the Act, 21 U.S.C. section 360bbb-3(b)(1), unless the authorization is terminated or revoked sooner.     Influenza A by PCR NEGATIVE NEGATIVE   Influenza B by PCR NEGATIVE NEGATIVE    Comment: (NOTE) The Xpert Xpress SARS-CoV-2/FLU/RSV plus assay is intended as an aid in the diagnosis of influenza from Nasopharyngeal swab specimens and should not be used as a sole basis for treatment. Nasal washings and aspirates are unacceptable for Xpert Xpress SARS-CoV-2/FLU/RSV testing.  Fact Sheet for Patients: EntrepreneurPulse.com.au  Fact Sheet for Healthcare Providers: IncredibleEmployment.be  This test is not yet approved or cleared by the [place] FDA and has been authorized for detection and/or diagnosis of SARS-CoV-2 by FDA under an Emergency Use Authorization (EUA). This EUA will remain in effect (meaning this test can be used) for the duration of the COVID-19 declaration under Section 564(b)(1) of the Act, 21 U.S.C. section 360bbb-3(b)(1), unless the authorization is terminated or revoked.  Performed at [facility], [place] [address], [place], [place] [address]   CBC     Status: None   Collection Time: 08/27/20  4:28 AM  Result Value Ref Range   WBC 8.8 4.0 - 10.5 K/uL   RBC 4.06 3.87 - 5.11 MIL/uL   Hemoglobin 12.8 12.0 - 15.0 g/dL   HCT 38.8 36.0 - 46.0 %   MCV 95.6 80.0 - 100.0 fL   MCH 31.5 26.0 - 34.0 pg   MCHC 33.0 30.0 - 36.0 g/dL   RDW 13.4 11.5 - 15.5 %   Platelets 347 150 - 400 K/uL   nRBC 0.0 0.0 - 0.2 %    Comment: Performed at [facility], [place] [address] [place], [place] [address]  Creatinine, serum  Status: None   Collection Time: 08/27/20  4:28 AM  Result Value Ref Range   Creatinine, Ser 0.68 0.44 - 1.00 mg/dL   GFR, Estimated >60 >60 mL/min     Comment: (NOTE) Calculated using the CKD-EPI Creatinine Equation (2021) Performed at [facility], [place] [address] [place], [place] [address]   Comprehensive metabolic panel     Status: Abnormal   Collection Time: 08/27/20  4:28 AM  Result Value Ref Range   Sodium 138 135 - 145 mmol/L   Potassium 3.8 3.5 - 5.1 mmol/L   Chloride 108 98 - 111 mmol/L   CO2 23 22 - 32 mmol/L   Glucose, Bld 131 (H) 70 - 99 mg/dL    Comment: Glucose reference range applies only to samples taken after fasting for at least 8 hours.   BUN 9 6 - 20 mg/dL   Creatinine, Ser 0.72 0.44 - 1.00 mg/dL   Calcium 8.8 (L) 8.9 - 10.3 mg/dL   Total Protein 6.8 6.5 - 8.1 g/dL   Albumin 3.7 3.5 - 5.0 g/dL   AST 31 15 - 41 U/L   ALT 52 (H) 0 - 44 U/L   Alkaline Phosphatase 51 38 - 126 U/L   Total Bilirubin 0.4 0.3 - 1.2 mg/dL   GFR, Estimated >60 >60 mL/min    Comment: (NOTE) Calculated using the CKD-EPI Creatinine Equation (2021)    Anion gap 7 5 - 15    Comment: Performed at [facility], [place] [address] [place], [place] [address]  CBC     Status: None   Collection Time: 08/27/20  4:28 AM  Result Value Ref Range   WBC 9.5 4.0 - 10.5 K/uL   RBC 4.03 3.87 - 5.11 MIL/uL   Hemoglobin 12.5 12.0 - 15.0 g/dL   HCT 38.9 36.0 - 46.0 %   MCV 96.5 80.0 - 100.0 fL   MCH 31.0 26.0 - 34.0 pg   MCHC 32.1 30.0 - 36.0 g/dL   RDW 13.5 11.5 - 15.5 %   Platelets 335 150 - 400 K/uL   nRBC 0.0 0.0 - 0.2 %    Comment: Performed at [facility], [place] [address], [place], [place] [address]  Lipase, blood     Status: Abnormal   Collection Time: 08/27/20  4:28 AM  Result Value Ref Range   Lipase 1,335 (H) 11 - 51 U/L    Comment: RESULTS CONFIRMED BY MANUAL DILUTION Performed at [facility], [place] [address] [place],  [address]   Lipid panel     Status: Abnormal   Collection Time: 08/27/20  4:28 AM  Result Value Ref Range   Cholesterol 146 0 - 200  mg/dL   Triglycerides 71 <150 mg/dL   HDL 39 (L) >40 mg/dL   Total CHOL/HDL Ratio 3.7 RATIO   VLDL 14 0 - 40 mg/dL   LDL Cholesterol 93 0 - 99 mg/dL    Comment:        Total Cholesterol/HDL:CHD Risk Coronary Heart Disease Risk Table                     Men   Women  1/2 Average Risk   3.4   3.3  Average Risk       5.0   4.4  2 X Average Risk   9.6   7.1  3 X Average Risk  23.4   11.0        Use  the calculated Patient Ratio above and the CHD Risk Table to determine the patient's CHD Risk.        ATP III CLASSIFICATION (LDL):  <100     mg/dL   Optimal  100-129  mg/dL   Near or Above                    Optimal  130-159  mg/dL   Borderline  160-189  mg/dL   High  >190     mg/dL   Very High Performed at [place] [address], [place], [place] [address]    CT ABDOMEN PELVIS W CONTRAST  Result Date: 08/26/2020 CLINICAL DATA:  Abdominal pain EXAM: CT ABDOMEN AND PELVIS WITH CONTRAST TECHNIQUE: Multidetector CT imaging of the abdomen and pelvis was performed using the standard protocol following bolus administration of intravenous contrast. CONTRAST:  141m OMNIPAQUE IOHEXOL 300 MG/ML  SOLN COMPARISON:  Noncontrast study 07/14/2019 FINDINGS: Lower chest: No acute abnormality. Hepatobiliary: No focal liver abnormality. Gallbladder is unremarkable. No biliary dilatation. Pancreas: Unremarkable. Spleen: Unremarkable. Adrenals/Urinary Tract: Adrenals, kidneys, and partially distended bladder are unremarkable. Stomach/Bowel: Stomach is within limits. Bowel is in caliber. Probable small diverticulum of the duodenum. Bowel is normal in caliber. Normal appendix. Vascular/Lymphatic: No significant vascular abnormality. No enlarged lymph nodes. Reproductive: Intrauterine device is present.  No adnexal mass. Other: No ascites.  Abdominal wall is unremarkable. Musculoskeletal: No acute osseous abnormality. IMPRESSION: No acute abnormality. Electronically Signed   By: PMacy Mis M.D.   On: 08/26/2020 20:50   UKoreaAbdomen Limited RUQ (LIVER/GB)  Result Date: 08/27/2020 CLINICAL DATA:  Pancreatitis EXAM: ULTRASOUND ABDOMEN LIMITED RIGHT UPPER QUADRANT COMPARISON:  CT from same day FINDINGS: Gallbladder: Gallstones are noted without evidence for gallbladder wall thickening or pericholecystic free fluid. The sonographic MPercell Millersign is negative. Common bile duct: Diameter: 4 mm Liver: There is hepatic steatosis. There is a hypoechoic area adjacent to the gallbladder fossa that is geographic in distribution and is favored to represent an area of focal fatty sparing. Portal vein is patent on color Doppler imaging with normal direction of blood flow towards the liver. Other: None. IMPRESSION: 1. There is cholelithiasis without secondary signs of acute cholecystitis. 2. Hepatic steatosis. Electronically Signed   By: CConstance HolsterM.D.   On: 08/27/2020 01:28    Anti-infectives (From admission, onward)   None       Assessment/Plan Obesity BMI 32.80 COVID+  Acute pancreatitis, suspect 2/2 gallstones - Patient with suspected gallstone pancreatitis. No signs of cholecystitis. Continue medical management of pancreatitis with IVF rehydration, bowel rest, and symptom control. Trend lipase. She would benefit from cholecystectomy once pancreatitis resolves to prevent this from occurring again, but would not recommend surgery this admission given she is covid positive and the increased risks of pulmonary complications in these patients. We will plan to see the patient in our office in 3-4 weeks to arrange for outpatient cholecystectomy.   ID - none VTE - SCDs, lovenox FEN - IVF, NPO Foley - none Follow up - TBD  MAlferd Apa [facility] Center[facility] 08/27/2020, 10:59 AM Please see Amion for pager number during day hours 7:00am-4:30pm

## 2020-08-28 MED ORDER — IBUPROFEN 200 MG PO TABS
400.0000 mg | ORAL_TABLET | Freq: Three times a day (TID) | ORAL | Status: DC
  Administered 2020-08-28 – 2020-08-29 (×4): 400 mg via ORAL
  Filled 2020-08-28 (×4): qty 2

## 2020-08-28 NOTE — Progress Notes (Signed)
PROGRESS NOTE   Kelsey Thomas  HAL:[id number] DOB: [birth year] DOA: 08/26/2020 PCP: Patient, No Pcp Per  Brief Narrative:  40 year old Hispanic female grand multiparity Scant medical history other than BMI 32 Developed epigastric pain within the last week when she was seen at a clinic-Rx UTI Bactrim-pain now becoming 10/10 CT = acute pancreatitis-lipase 3457, incidentally COVID-19   Assessment & Plan:   Principal Problem:   Acute pancreatitis Active Problems:   COVID-19 virus infection   Hypokalemia   1. Acute pancreatitis a. Likely choledocholithiasis b. Not tolerating diet and having severe pain-back down to soft diet c. Outpatient follow-up with general surgery for planning of interval cholecystectomy 2. Coincidental COVID-positive asymptomatic a. No oxygen requirement-monitor trends- 3. BMI 32 4. Grand multiparity  DVT prophylaxis: Lovenox Code Status: Full Family Communication: None Disposition:  Status is: Inpatient for now until pain is relatively controlled  Remains inpatient appropriate because:Persistent severe electrolyte disturbances, Ongoing active pain requiring inpatient pain management and Unsafe d/c plan   Dispo: The patient is from: Home              Anticipated d/c is to: Home              Anticipated d/c date is: 2 days              Patient currently is not medically stable to d/c.       Consultants:   General surgery  Procedures: None  Antimicrobials: None currently   Subjective: Had breakfast this morning but then had 7/10 pain Ate lunch but had severe pain Long discussion with patient/RN who is at bedside and agreeable to staying in hospital for now   Objective: Vitals:   08/27/20 1321 08/27/20 2040 08/28/20 0400 08/28/20 1308  BP: 134/85 140/88 127/85 125/83  Pulse: 67 60 64 (!) 58  Resp: 18 18 18 17   Temp: 98 F (36.7 C) 97.6 F (36.4 C) 97.8 F (36.6 C) 98.6 F (37 C)  TempSrc: Oral Oral Oral Oral  SpO2: 98% 97%  98% 98%  Weight:      Height:        Intake/Output Summary (Last 24 hours) at 08/28/2020 1455 Last data filed at 08/28/2020 1406 Gross per 24 hour  Intake 2309.63 ml  Output --  Net 2309.63 ml   Filed Weights   08/26/20 2242  Weight: 84 kg    Examination:  EOMI NCAT obese S1-S2 no murmur Epigastric tenderness left lower quadrant tenderness No rebound  Data Reviewed: I have personally reviewed following labs and imaging studies  Sodium 138 potassium 3.8 BUN/creatinine 9/0.7 WBC 9.5 Lipase 3457-->1335 Triglycerides 71 Ultrasound abdomen pelvis shows cholelithiasis without signs of cholecystitis  COVID-19 Labs  No results for input(s): DDIMER, FERRITIN, LDH, CRP in the last 72 hours.  Lab Results  Component Value Date   SARSCOV2NAA POSITIVE (A) 08/26/2020     Radiology Studies: CT ABDOMEN PELVIS W CONTRAST  Result Date: 08/26/2020 CLINICAL DATA:  Abdominal pain EXAM: CT ABDOMEN AND PELVIS WITH CONTRAST TECHNIQUE: Multidetector CT imaging of the abdomen and pelvis was performed using the standard protocol following bolus administration of intravenous contrast. CONTRAST:  10/24/2020 OMNIPAQUE IOHEXOL 300 MG/ML  SOLN COMPARISON:  Noncontrast study 07/14/2019 FINDINGS: Lower chest: No acute abnormality. Hepatobiliary: No focal liver abnormality. Gallbladder is unremarkable. No biliary dilatation. Pancreas: Unremarkable. Spleen: Unremarkable. Adrenals/Urinary Tract: Adrenals, kidneys, and partially distended bladder are unremarkable. Stomach/Bowel: Stomach is within limits. Bowel is in caliber. Probable small diverticulum of the duodenum. Bowel  is normal in caliber. Normal appendix. Vascular/Lymphatic: No significant vascular abnormality. No enlarged lymph nodes. Reproductive: Intrauterine device is present.  No adnexal mass. Other: No ascites.  Abdominal wall is unremarkable. Musculoskeletal: No acute osseous abnormality. IMPRESSION: No acute abnormality. Electronically Signed   By:  Guadlupe Spanish M.D.   On: 08/26/2020 20:50   US Abdomen Limited RUQ (LIVER/GB)  Result Date: 08/27/2020 CLINICAL DATA:  Pancreatitis EXAM: ULTRASOUND ABDOMEN LIMITED RIGHT UPPER QUADRANT COMPARISON:  CT from same day FINDINGS: Gallbladder: Gallstones are noted without evidence for gallbladder wall thickening or pericholecystic free fluid. The sonographic Eulah Pont sign is negative. Common bile duct: Diameter: 4 mm Liver: There is hepatic steatosis. There is a hypoechoic area adjacent to the gallbladder fossa that is geographic in distribution and is favored to represent an area of focal fatty sparing. Portal vein is patent on color Doppler imaging with normal direction of blood flow towards the liver. Other: None. IMPRESSION: 1. There is cholelithiasis without secondary signs of acute cholecystitis. 2. Hepatic steatosis. Electronically Signed   By: Katherine Mantle M.D.   On: 08/27/2020 01:28     Scheduled Meds: . enoxaparin (LOVENOX) injection  40 mg Subcutaneous Q24H  . ibuprofen  400 mg Oral TID   Continuous Infusions: . dextrose 5% lactated ringers with KCl 20 mEq/L 125 mL/hr at 08/28/20 0847     LOS: 2 days    Time spent: 28  Rhetta Mura, MD [facility] To contact the attending provider between 7A-7P or the covering provider during after hours 7P-7A, please log into the web site [email or web address] and access using universal [place] password for that web site. If you do not have the password, please call the hospital operator.  08/28/2020, 2:55 PM

## 2020-08-28 NOTE — Discharge Instructions (Signed)
Cholelithiasis  Cholelithiasis is a disease in which gallstones form in the gallbladder. The gallbladder is an organ that stores bile. Bile is a fluid that helps to digest fats. Gallstones begin as small crystals and can slowly grow into stones. They may cause no symptoms until they block the gallbladder duct, or cystic duct, when the gallbladder tightens (contracts) after food is eaten. This can cause pain and is known as a gallbladder attack, or biliary colic. There are two main types of gallstones:  Cholesterol stones. These are the most common type of gallstone. These stones are made of hardened cholesterol and are usually yellow-green in color. Cholesterol is a fat-like substance that is made in the liver.  Pigment stones. These are dark in color and are made of a red-yellow substance, called bilirubin,that forms when hemoglobin from red blood cells breaks down. What are the causes? This condition may be caused by an imbalance in the different parts that make bile. This can happen if the bile:  Has too much bilirubin. This can happen in certain blood diseases, such as sickle cell anemia.  Has too much cholesterol.  Does not have enough bile salts. These salts help the body absorb and digest fats. In some cases, this condition can also be caused by the gallbladder not emptying completely or often enough. This is common during pregnancy. What increases the risk? The following factors may make you more likely to develop this condition:  Being female.  Having multiple pregnancies. Health care providers sometimes advise removing diseased gallbladders before future pregnancies.  Eating a diet that is heavy in fried foods, fat, and refined carbohydrates, such as white bread and white rice.  Being obese.  Being older than age 40.  Using medicines that contain female hormones (estrogen) for a long time.  Losing weight quickly.  Having a family history of gallstones.  Having certain  medical problems, such as: ? Diabetes mellitus. ? Cystic fibrosis. ? Crohn's disease. ? Cirrhosis or other long-term (chronic) liver disease. ? Certain blood diseases, such as sickle cell anemia or leukemia. What are the signs or symptoms? In many cases, having gallstones causes no symptoms. When you have gallstones but do not have symptoms, you have silent gallstones. If a gallstone blocks your bile duct, it can cause a gallbladder attack. The main symptom of a gallbladder attack is sudden pain in the upper right part of the abdomen. The pain:  Usually comes at night or after eating.  Can last for one hour or more.  Can spread to your right shoulder, back, or chest.  Can feel like indigestion. This is discomfort, burning, or fullness in your upper abdomen. If the bile duct is blocked for more than a few hours, it can cause an infection or inflammation of your gallbladder (cholecystitis), liver, or pancreas. This can cause:  Nausea or vomiting.  Bloating.  Pain in your abdomen that lasts for 5 hours or longer.  Tenderness in your upper abdomen, often in the upper right section and under your rib cage.  Fever or chills.  Skin or the white parts of your eyes turning yellow (jaundice). This usually happens when a stone has blocked bile from passing through the common bile duct.  Dark urine or light-colored stools. How is this diagnosed? This condition may be diagnosed based on:  A physical exam.  Your medical history.  Ultrasound.  CT scan.  MRI. You may also have other tests, including:  Blood tests to check for signs of an   infection or inflammation.  Cholescintigraphy, or HIDA scan. This is a scan of your gallbladder and bile ducts (biliary system) using non-harmful radioactive material and special cameras that can see the radioactive material.  Endoscopic retrograde cholangiopancreatogram. This involves inserting a small tube with a camera on the end (endoscope)  through your mouth to look at bile ducts and check for blockages. How is this treated? Treatment for this condition depends on the severity of the condition. Silent gallstones do not need treatment. Treatment may be needed if a blockage causes a gallbladder attack or other symptoms. Treatment may include:  Home care, if symptoms are not severe. ? During a simple gallbladder attack, stop eating and drinking for 12-24 hours (except for water and clear liquids). This helps to "cool down" your gallbladder. After 1 or 2 days, you can start to eat a diet of simple or clear foods, such as broths and crackers. ? You may also need medicines for pain or nausea or both. ? If you have cholecystitis and an infection, you will need antibiotics.  A hospital stay, if needed for pain control or for cholecystitis with severe infection.  Cholecystectomy, or surgery to remove your gallbladder. This is the most common treatment if all other treatments have not worked.  Medicines to break up gallstones. These are most effective at treating small gallstones. Medicines may be used for up to 6-12 months.  Endoscopic retrograde cholangiopancreatogram. A small basket can be attached to the endoscope and used to capture and remove gallstones, mainly those that are in the common bile duct. Follow these instructions at home: Medicines  Take over-the-counter and prescription medicines only as told by your health care provider.  If you were prescribed an antibiotic medicine, take it as told by your health care provider. Do not stop taking the antibiotic even if you start to feel better.  Ask your health care provider if the medicine prescribed to you requires you to avoid driving or using machinery. Eating and drinking  Drink enough fluid to keep your urine pale yellow. This is important during a gallbladder attack. Water and clear liquids are preferred.  Follow a healthy diet. This includes: ? Reducing fatty foods,  such as fried food and foods high in cholesterol. ? Reducing refined carbohydrates, such as white bread and white rice. ? Eating more fiber. Aim for foods such as almonds, fruit, and beans. Alcohol use  If you drink alcohol: ? Limit how much you use to:  0-1 drink a day for nonpregnant women.  0-2 drinks a day for men. ? Be aware of how much alcohol is in your drink. In the [place] one drink equals one 12 oz bottle of beer (355 mL), one 5 oz glass of wine (148 mL), or one 1 oz glass of hard liquor (44 mL). General instructions  Do not use any products that contain nicotine or tobacco, such as cigarettes, e-cigarettes, and chewing tobacco. If you need help quitting, ask your health care provider.  Maintain a healthy weight.  Keep all follow-up visits as told by your health care provider. These may include consultations with a surgeon or specialist. This is important. Where to find more information  National Institute of Diabetes and Digestive and Kidney Diseases: www.niddk.nih.gov Contact a health care provider if:  You think you have had a gallbladder attack.  You have been diagnosed with silent gallstones and you develop pain in your abdomen or indigestion.  You begin to have attacks more often.  You have   dark urine or light-colored stools. Get help right away if:  You have pain from a gallbladder attack that lasts for more than 2 hours.  You have pain in your abdomen that lasts for more than 5 hours or is getting worse.  You have a fever or chills.  You have nausea and vomiting that do not go away.  You develop jaundice. Summary  Cholelithiasis is a disease in which gallstones form in the gallbladder.  This condition may be caused by an imbalance in the different parts that make bile. This can happen if your bile has too much bilirubin or cholesterol, or does not have enough bile salts.  Treatment for gallstones depends on the severity of the condition. Silent  gallstones do not need treatment.  If gallstones cause a gallbladder attack or other symptoms, treatment usually involves not eating or drinking anything. Treatment may also include pain medicines and antibiotics, and it sometimes includes a hospital stay.  Surgery to remove the gallbladder is common if all other treatments have not worked. This information is not intended to replace advice given to you by your health care provider. Make sure you discuss any questions you have with your health care provider. Document Revised: 06/24/2019 Document Reviewed: 06/24/2019 Elsevier Patient Education  2021 Elsevier Inc.  

## 2020-08-29 MED ORDER — ACETAMINOPHEN 500 MG PO TABS: 500.0000 mg | ORAL_TABLET | Freq: Four times a day (QID) | ORAL | 2 refills | Status: DC | PRN

## 2020-08-29 NOTE — Discharge Summary (Signed)
Physician Discharge Summary  Kelsey Thomas VOZ:[id number] DOB: September 15, 1980 DOA: 08/26/2020  PCP: Kelsey Thomas  Admit date: 08/26/2020 Discharge date: 08/29/2020  Time spent: 27 minutes  Recommendations for Outpatient Follow-up:  1. Needs outpatient cholecystectomy-follow-up appointment on paperwork 2. For discharge Tylenol, second choice ibuprofen for pancreatic pain  Discharge Diagnoses:  Principal Problem:   Acute pancreatitis Active Problems:   COVID-19 virus infection   Hypokalemia   Discharge Condition: Improved  Diet recommendation: Soft  Filed Weights   08/26/20 2242  Weight: 84 kg    History of present illness:  40 year old Hispanic female grand multiparity Scant medical history other than BMI 32 Developed epigastric pain within the last week when she was seen at a clinic-Rx UTI Bactrim-pain now becoming 10/10 CT = acute pancreatitis-lipase 3457, incidentally COVID-19  Hospital Course:  1. Acute pancreatitis a. Likely choledocholithiasis given improvement of LFTs no rise in alk phos and overall improvement b. Hospital course significant for increasing pain when graduating diet-patient graduated to soft diet on discharge c. Outpatient follow-up with general surgery for planning of interval cholecystectomy 2. Coincidental COVID-positive asymptomatic a. No oxygen requirement-monitor trends- 3. BMI 32 4. Kelsey Thomas multiparity   Procedures:  Consultations:  General surgery  Discharge Exam: Vitals:   08/28/20 2042 08/29/20 0529  BP: 139/84 116/67  Pulse: (!) 53 (!) 52  Resp: 19 18  Temp: (!) 97.5 F (36.4 C) 97.7 F (36.5 C)  SpO2: 99% 98%    General: Awake coherent no distress EOMI NCAT no focal deficit Cardiovascular: S1-S2 no murmur no rub no gallop RRR Respiratory: Clinically clear no added sounds rales rhonchi Abdomen soft no rebound no guarding  Discharge Instructions   Discharge Instructions    Diet - low sodium heart healthy    Complete by: As directed    Discharge instructions   Complete by: As directed    Make sure that you follow-up with general surgery I have provided on the discharge paperwork to the name of the surgeon who you should call in the next week to week and a half if you do not hear from them and schedule an appointment to see them in the office as you will need to have your gallbladder taken out You can take Tylenol first choice for pain, ibuprofen second choice for pai and would recommend that you eat a soft diet for the next several days until you are able to tolerate this diet  Please follow-up with your primary physician   Asegrese de realizar un seguimiento con ciruga general que he proporcionado en la documentacin de alta al Kelsey Thomas del cirujano a quien debe llamar en la prxima semana o semana y media si no tiene noticias de ellos y Risk manager una cita para verlos. en la oficina ya que necesitar que le saquen la vescula biliar Puede tomar Tylenol como primera opcin para Conservation officer, historic buildings, ibuprofeno como segunda opcin para Conservation officer, historic buildings y Public affairs consultant que siga una dieta blanda durante los prximos das hasta que pueda tolerar esta dieta.   Increase activity slowly   Complete by: As directed    Increase activity slowly   Complete by: As directed      Allergies as of 08/29/2020   No Known Allergies     Medication List    TAKE these medications   acetaminophen 500 MG tablet Commonly known as: TYLENOL Take 1 tablet (500 mg total) by mouth every 6 (six) hours as needed.   ibuprofen 200 MG tablet Commonly known as:  ADVIL Take 600-800 mg by mouth every 6 (six) hours as needed for mild pain.      No Known Allergies  Follow-up Information    Kelsey Keens, MD. Go on 09/25/2020.   Specialty: General Surgery Why: @ 1050am. Please arrive 30 minutes prior to your appointment for paperwork. Please bring a copy of your photo ID and insurance card.  Contact information: Kelsey Thomas [phone]                The results of significant diagnostics from this hospitalization (including imaging, microbiology, ancillary and laboratory) are listed below for reference.    Significant Diagnostic Studies: CT ABDOMEN PELVIS W CONTRAST  Result Date: 08/26/2020 CLINICAL DATA:  Abdominal pain EXAM: CT ABDOMEN AND PELVIS WITH CONTRAST TECHNIQUE: Multidetector CT imaging of the abdomen and pelvis was performed using the standard protocol following bolus administration of intravenous contrast. CONTRAST:  132m OMNIPAQUE IOHEXOL 300 MG/ML  SOLN COMPARISON:  Noncontrast study 07/14/2019 FINDINGS: Lower chest: No acute abnormality. Hepatobiliary: No focal liver abnormality. Gallbladder is unremarkable. No biliary dilatation. Pancreas: Unremarkable. Spleen: Unremarkable. Adrenals/Urinary Tract: Adrenals, kidneys, and partially distended bladder are unremarkable. Stomach/Bowel: Stomach is within limits. Bowel is in caliber. Probable small diverticulum of the duodenum. Bowel is normal in caliber. Normal appendix. Vascular/Lymphatic: No significant vascular abnormality. No enlarged lymph nodes. Reproductive: Intrauterine device is present.  No adnexal mass. Other: No ascites.  Abdominal wall is unremarkable. Musculoskeletal: No acute osseous abnormality. IMPRESSION: No acute abnormality. Electronically Signed   By: PMacy MisM.D.   On: 08/26/2020 20:50   [place]Abdomen Limited RUQ (LIVER/GB)  Result Date: 08/27/2020 CLINICAL DATA:  Pancreatitis EXAM: ULTRASOUND ABDOMEN LIMITED RIGHT UPPER QUADRANT COMPARISON:  CT from same day FINDINGS: Gallbladder: Gallstones are noted without evidence for gallbladder wall thickening or pericholecystic free fluid. The sonographic MPercell Millersign is negative. Common bile duct: Diameter: 4 mm Liver: There is hepatic steatosis. There is a hypoechoic area adjacent to the gallbladder fossa that is geographic in distribution and is favored to  represent an area of focal fatty sparing. Portal vein is patent on color Doppler imaging with normal direction of blood flow towards the liver. Other: None. IMPRESSION: 1. There is cholelithiasis without secondary signs of acute cholecystitis. 2. Hepatic steatosis. Electronically Signed   By: CConstance HolsterM.D.   On: 08/27/2020 01:28    Microbiology: Recent Results (from the past 240 hour(s))  Resp Panel by RT-PCR (Flu A&B, Covid) Nasopharyngeal Swab     Status: Abnormal   Collection Time: 08/26/20  6:48 PM   Specimen: Nasopharyngeal Swab; Nasopharyngeal(NP) swabs in vial transport medium  Result Value Ref Range Status   SARS Coronavirus 2 by RT PCR POSITIVE (A) NEGATIVE Final    Comment: RESULT CALLED TO, READ BACK BY AND VERIFIED WITH: SMITH J. 01.12.22 @ 2021 BY MECIAL J. (NOTE) SARS-CoV-2 target nucleic acids are DETECTED.  The SARS-CoV-2 RNA is generally detectable in upper respiratory specimens during the acute phase of infection. Positive results are indicative of the presence of the identified virus, but do not rule out bacterial infection or co-infection with other pathogens not detected by the test. Clinical correlation with patient history and other diagnostic information is necessary to determine patient infection status. The expected result is Negative.  Fact Sheet for Patients: hEntrepreneurPulse.com.au Fact Sheet for Healthcare Providers: hIncredibleEmployment.be This test is not yet approved or cleared by the [place]  has been authorized  for detection and/or diagnosis of SARS-CoV-2 by FDA under an Emergency Use Authorization (EUA).  This EUA will remain in effect (meaning this test ca n be used) for the duration of  the COVID-19 declaration under Section 564(b)(1) of the Act, 21 U.S.C. section 360bbb-3(b)(1), unless the authorization is terminated or revoked sooner.     Influenza A by PCR NEGATIVE NEGATIVE  Final   Influenza B by PCR NEGATIVE NEGATIVE Final    Comment: (NOTE) The Xpert Xpress SARS-CoV-2/FLU/RSV plus assay is intended as an aid in the diagnosis of influenza from Nasopharyngeal swab specimens and should not be used as a sole basis for treatment. Nasal washings and aspirates are unacceptable for Xpert Xpress SARS-CoV-2/FLU/RSV testing.  Fact Sheet for Patients: EntrepreneurPulse.com.au  Fact Sheet for Healthcare Providers: IncredibleEmployment.be  This test is not yet approved or cleared by the [place] FDA and has been authorized for detection and/or diagnosis of SARS-CoV-2 by FDA under an Emergency Use Authorization (EUA). This EUA will remain in effect (meaning this test can be used) for the duration of the COVID-19 declaration under Section 564(b)(1) of the Act, 21 U.S.C. section 360bbb-3(b)(1), unless the authorization is terminated or revoked.  Performed at [facility], [place] [address] [place], [place] [address]      Labs: Basic Metabolic Panel: Recent Labs  Lab 08/26/20 1618 08/27/20 0428  NA 138 138  K 3.4* 3.8  CL 103 108  CO2 25 23  GLUCOSE 102* 131*  BUN 12 9  CREATININE 0.71 0.72  0.68  CALCIUM 9.5 8.8*   Liver Function Tests: Recent Labs  Lab 08/26/20 1618 08/27/20 0428  AST 43* 31  ALT 68* 52*  ALKPHOS 69 51  BILITOT 0.6 0.4  PROT 8.5* 6.8  ALBUMIN 4.7 3.7   Recent Labs  Lab 08/26/20 1618 08/27/20 0428  LIPASE 3,457* 1,335*   No results for input(s): AMMONIA in the last 168 hours. CBC: Recent Labs  Lab 08/26/20 1618 08/27/20 0428  WBC 12.4* 9.5  8.8  HGB 14.1 12.5  12.8  HCT 43.2 38.9  38.8  MCV 96.0 96.5  95.6  PLT 367 335  347   Cardiac Enzymes: No results for input(s): CKTOTAL, CKMB, CKMBINDEX, TROPONINI in the last 168 hours. BNP: BNP (last 3 results) No results for input(s): BNP in the last 8760 hours.  ProBNP (last 3 results) No results for  input(s): PROBNP in the last 8760 hours.  CBG: No results for input(s): GLUCAP in the last 168 hours.     Signed:  Nita Sells MD   [facility] 08/29/2020, 10:27 AM

## 2020-08-29 NOTE — Progress Notes (Signed)
Went over discharge instructions w/ patient using interpreter number [phone]. Patient verbalized understanding.

## 2020-09-15 ENCOUNTER — Emergency Department (HOSPITAL_COMMUNITY)
Admission: EM | Admit: 2020-09-15 | Discharge: 2020-09-15 | Disposition: A | Discharge status: Home / Self Care | Payer: Self-pay | Attending: Emergency Medicine | Admitting: Emergency Medicine

## 2020-09-15 ENCOUNTER — Emergency Department (HOSPITAL_COMMUNITY): Payer: Self-pay

## 2020-09-15 ENCOUNTER — Encounter (HOSPITAL_COMMUNITY): Payer: Self-pay

## 2020-09-15 ENCOUNTER — Other Ambulatory Visit: Payer: Self-pay

## 2020-09-15 DIAGNOSIS — R112 Nausea with vomiting, unspecified: Secondary | ICD-10-CM | POA: Insufficient documentation

## 2020-09-15 DIAGNOSIS — R1013 Epigastric pain: Secondary | ICD-10-CM | POA: Insufficient documentation

## 2020-09-15 DIAGNOSIS — Z8616 Personal history of COVID-19: Secondary | ICD-10-CM | POA: Insufficient documentation

## 2020-09-15 DIAGNOSIS — R109 Unspecified abdominal pain: Secondary | ICD-10-CM

## 2020-09-15 LAB — CBC
HCT: 42.2 % (ref 36.0–46.0)
Hemoglobin: 14.1 g/dL (ref 12.0–15.0)
MCH: 31.5 pg (ref 26.0–34.0)
MCHC: 33.4 g/dL (ref 30.0–36.0)
MCV: 94.2 fL (ref 80.0–100.0)
Platelets: 361 10*3/uL (ref 150–400)
RBC: 4.48 MIL/uL (ref 3.87–5.11)
RDW: 13.5 % (ref 11.5–15.5)
WBC: 11.4 10*3/uL — ABNORMAL HIGH (ref 4.0–10.5)
nRBC: 0 % (ref 0.0–0.2)

## 2020-09-15 LAB — URINALYSIS, ROUTINE W REFLEX MICROSCOPIC
Bilirubin Urine: NEGATIVE
Glucose, UA: NEGATIVE mg/dL
Hgb urine dipstick: NEGATIVE
Ketones, ur: NEGATIVE mg/dL
Nitrite: NEGATIVE
Protein, ur: 30 mg/dL — AB
Specific Gravity, Urine: 1.033 — ABNORMAL HIGH (ref 1.005–1.030)
pH: 6 (ref 5.0–8.0)

## 2020-09-15 LAB — COMPREHENSIVE METABOLIC PANEL
ALT: 47 U/L — ABNORMAL HIGH (ref 0–44)
AST: 35 U/L (ref 15–41)
Albumin: 4.8 g/dL (ref 3.5–5.0)
Alkaline Phosphatase: 67 U/L (ref 38–126)
Anion gap: 12 (ref 5–15)
BUN: 19 mg/dL (ref 6–20)
CO2: 22 mmol/L (ref 22–32)
Calcium: 9.9 mg/dL (ref 8.9–10.3)
Chloride: 104 mmol/L (ref 98–111)
Creatinine, Ser: 0.81 mg/dL (ref 0.44–1.00)
GFR, Estimated: >60 mL/min (ref >60)
Glucose, Bld: 119 mg/dL — ABNORMAL HIGH (ref 70–99)
Potassium: 4 mmol/L (ref 3.5–5.1)
Sodium: 138 mmol/L (ref 135–145)
Total Bilirubin: 0.6 mg/dL (ref 0.3–1.2)
Total Protein: 8.3 g/dL — ABNORMAL HIGH (ref 6.5–8.1)

## 2020-09-15 LAB — POC URINE PREG, ED: Preg Test, Ur: NEGATIVE

## 2020-09-15 LAB — LIPASE, BLOOD: Lipase: 62 U/L — ABNORMAL HIGH (ref 11–51)

## 2020-09-15 MED ORDER — HYDROCODONE-ACETAMINOPHEN 5-325 MG PO TABS
1.0000 | ORAL_TABLET | Freq: Four times a day (QID) | ORAL | 0 refills | Status: DC | PRN
Start: 2020-09-15 — End: 2024-01-03

## 2020-09-15 MED ORDER — SODIUM CHLORIDE 0.9 % IV BOLUS
1000.0000 mL | Freq: Once | INTRAVENOUS | Status: AC
  Administered 2020-09-15: 1000 mL via INTRAVENOUS

## 2020-09-15 MED ORDER — SODIUM CHLORIDE 0.9 % IV SOLN
1.0000 g | Freq: Once | INTRAVENOUS | Status: AC
  Administered 2020-09-15: 1 g via INTRAVENOUS
  Filled 2020-09-15: qty 10

## 2020-09-15 MED ORDER — CEPHALEXIN 500 MG PO CAPS: 500.0000 mg | ORAL_CAPSULE | Freq: Four times a day (QID) | ORAL | 0 refills | Status: AC

## 2020-09-15 MED ORDER — HYDROMORPHONE HCL 1 MG/ML IJ SOLN
1.0000 mg | Freq: Once | INTRAMUSCULAR | Status: AC
  Administered 2020-09-15: 1 mg via INTRAVENOUS
  Filled 2020-09-15: qty 1

## 2020-09-15 MED ORDER — IOHEXOL 300 MG/ML  SOLN
100.0000 mL | Freq: Once | INTRAMUSCULAR | Status: AC | PRN
  Administered 2020-09-15: 100 mL via INTRAVENOUS

## 2020-09-15 MED ORDER — ONDANSETRON HCL 4 MG/2ML IJ SOLN
4.0000 mg | Freq: Once | INTRAMUSCULAR | Status: AC
  Administered 2020-09-15: 4 mg via INTRAVENOUS
  Filled 2020-09-15: qty 2

## 2020-09-15 NOTE — ED Triage Notes (Signed)
Patient arrived with complaints of left sided abdominal pain, back pain, nausea and vomiting that started tonight at 12. Reports she is supposed to have gallbladder surgery on the 11th. Reports taking ibuprofen.

## 2020-09-15 NOTE — Discharge Instructions (Addendum)
Como discutimos, te estoy recetando 2 medicamentos.  El English as a second language teacher se llama Keflex. Este es un antibitico fuerte. Har que tomes esto 4 veces al [employer] prximos 7 [place].  Tambin te he recetado Vicodin. Este es un analgsico narctico fuerte. Esta es una combinacin de hidrocodona, que es un narctico, as Brewing technologist, que es el mismo medicamento que Tylenol. Puede continuar tomando Tylenol 2 o 3 veces al da adems del dolor intenso, pero asegrese de no tomar ms de 3000 miligramos de Tylenol en 1 da.  Contina tomando 600 a 800 mg de ibuprofeno 2-3 veces al da tambin para Chief Technology Officer.  Si experimenta sntomas nuevos o que empeoran, siempre puede regresar al departamento de emergencias para una reevaluacin. De lo contrario, haga un seguimiento con su cirujano con respecto a su cita el 11 de febrero.  Fue Curator.

## 2020-09-15 NOTE — ED Provider Notes (Signed)
Patient is a 40 year old female whose care was transferred to me by [facility] PA-C.  Her HPI is below:  Kelsey Thomas is a 40 y.o. female with a hx of pancreatitis who presents to the ED with complaints of abdominal pain since midnight. Patient states pain is to the left side of her stomach & back, it is constant, and without alleviating/aggravating factors. She reports associated nausea with 3 episodes of emesis. Has been having normal bowel movements. Denies fever, hematemesis, diarrhea, melena, hematochezia, dysuria, hematuria, vaginal bleeding/discharge, chest pain, or dyspnea. Scheduled for cholecystectomy on the 11th of this month, but this patient feels different than prior gallbladder/pancreas issues.   Interpretor utilized throughout Audiological scientist.   Physical Exam  BP 119/82   Pulse 71   Temp 97.6 F (36.4 C) (Oral)   Resp 18   LMP 08/19/2020   SpO2 98%   Physical Exam Vitals and nursing note reviewed.  Constitutional:      General: She is in acute distress (mild, appears uncomfortable. ).     Appearance: She is well-developed. She is not toxic-appearing.  HENT:     Head: Normocephalic and atraumatic.  Eyes:     General:        Right eye: No discharge.        Left eye: No discharge.     Conjunctiva/sclera: Conjunctivae normal.  Cardiovascular:     Rate and Rhythm: Normal rate and regular rhythm.  Pulmonary:     Effort: Pulmonary effort is normal. No respiratory distress.     Breath sounds: Normal breath sounds. No wheezing, rhonchi or rales.  Abdominal:     General: There is no distension.     Palpations: Abdomen is soft.     Tenderness: There is abdominal tenderness in the epigastric area, periumbilical area, left upper quadrant and left lower quadrant. There is left CVA tenderness.  Musculoskeletal:     Cervical back: Neck supple.  Skin:    General: Skin is warm and dry.     Findings: No rash.  Neurological:     Mental Status: She is alert.     Comments:  Clear speech.   Psychiatric:        Behavior: Behavior normal.  ED Course/Procedures   Clinical Course as of 09/15/20 0851  Tue Sep 15, 2020  0802 CT Abdomen Pelvis W Contrast IMPRESSION: 1. No acute finding. 2. Hepatic steatosis. [LJ]    Clinical Course User Index [LJ] Placido Sou, PA-C    Procedures  MDM   Pt is a 40 y.o. female whose care was transferred to me at shift change from [facility].  Patient presents today due to left-sided abdominal pain as well as left sided back pain.  Recent history of prior gallbladder/pancreatitis.  Patient is scheduled for cholecystectomy on the 11th of this month.  Labs: CBC with leukocytosis of 11.4. Otherwise, unremarkable. CMP with a glucose of 119, total protein of 8.3 and ALT of 47. Pregnancy test negative. UA with elevated specific gravity of 1.033, protein of 30, moderate leukocytes, rare bacteria and 21-50 WBCs. UA cultured.   Imaging: CT scan of the abdomen and pelvis with no acute findings.  Hepatic steatosis.  I, Placido Sou, PA-C, personally reviewed and evaluated these images and lab results as part of my medical decision-making.  Unsure of the cause of the patient's symptoms.  CT scan is reassuring.  Very mild leukocytosis which is likely reactive.  Concerning UA with moderate leukocytes, rare bacteria, as well as 21-50  white blood cells.  Culture sent.  Given her abdominal pain as well as back pain there is concern for cystitis versus pyelonephritis.  Patient denies any urinary complaints to me.  No pelvic pain.  No concern for STI.  Will give 1 g of Rocephin.  Will discharge on a course of Keflex.  Patient given a short course of Vicodin for breakthrough pain.  We discussed safety regarding this medication.  Recommended follow-up with surgery for her scheduled procedure on the 11th.  We discussed return precautions.  Her questions were answered and she was amicable at the time of discharge.  Note: Portions of this  report may have been transcribed using voice recognition software. Every effort was made to ensure accuracy; however, inadvertent computerized transcription errors may be present.         Placido Sou, PA-C 09/15/20 0932    Derwood Kaplan, MD 09/15/20 1136

## 2020-09-15 NOTE — ED Provider Notes (Signed)
[facility]-EMERGENCY DEPT Provider Note   CSN: [id number] Arrival date & time: 09/15/20  0131     History Chief Complaint  Patient presents with  . Abdominal Pain    Kelsey Thomas is a 40 y.o. female with a hx of pancreatitis who presents to the ED with complaints of abdominal pain since midnight. Patient states pain is to the left side of her stomach & back, it is constant, and without alleviating/aggravating factors. She reports associated nausea with 3 episodes of emesis. Has been having normal bowel movements. Denies fever, hematemesis, diarrhea, melena, hematochezia, dysuria, hematuria, vaginal bleeding/discharge, chest pain, or dyspnea. She denies concern for STI. Scheduled for cholecystectomy on the 11th of this month, but this patient feels different than prior gallbladder/pancreas issues.   Interpretor utilized throughout Audiological scientist.   HPI     Past Medical History:  Diagnosis Date  . Medical history non-contributory   . UTI (urinary tract infection) 08/18/2020    Patient Active Problem List   Diagnosis Date Noted  . COVID-19 virus infection 08/26/2020  . Hypokalemia 08/26/2020  . Acute pancreatitis 08/26/2020  . Active labor at term 09/25/2014    Past Surgical History:  Procedure Laterality Date  . NO PAST SURGERIES       OB History    Gravida  3   Para  3   Term  3   Preterm  0   AB  0   Living  3     SAB  0   IAB  0   Ectopic  0   Multiple  0   Live Births  3           No family history on file.  Social History   Tobacco Use  . Smoking status: Never Smoker  . Smokeless tobacco: Never Used  Substance Use Topics  . Alcohol use: No  . Drug use: No    Home Medications Prior to Admission medications   Medication Sig Start Date End Date Taking? Authorizing Provider  acetaminophen (TYLENOL) 500 MG tablet Take 1 tablet (500 mg total) by mouth every 6 (six) hours as needed. 08/29/20 08/29/21  Rhetta Mura, MD  ibuprofen (ADVIL) 200 MG tablet Take 600-800 mg by mouth every 6 (six) hours as needed for mild pain.    [provider]    Allergies    Patient has no known allergies.  Review of Systems   Review of Systems  Constitutional: Negative for fever.  Respiratory: Negative for shortness of breath.   Cardiovascular: Negative for chest pain.  Gastrointestinal: Positive for abdominal pain, nausea and vomiting. Negative for blood in stool, constipation and diarrhea.  Genitourinary: Negative for dysuria, vaginal bleeding and vaginal discharge.  Neurological: Negative for syncope.  All other systems reviewed and are negative.   Physical Exam Updated Vital Signs BP (!) 142/105 (BP Location: Left Arm)   Pulse 78   Temp 97.6 F (36.4 C) (Oral)   Resp 19   LMP 08/19/2020   SpO2 100%   Physical Exam Vitals and nursing note reviewed.  Constitutional:      General: She is in acute distress (mild, appears uncomfortable. ).     Appearance: She is well-developed. She is not toxic-appearing.  HENT:     Head: Normocephalic and atraumatic.  Eyes:     General:        Right eye: No discharge.        Left eye: No discharge.  Conjunctiva/sclera: Conjunctivae normal.  Cardiovascular:     Rate and Rhythm: Normal rate and regular rhythm.  Pulmonary:     Effort: Pulmonary effort is normal. No respiratory distress.     Breath sounds: Normal breath sounds. No wheezing, rhonchi or rales.  Abdominal:     General: There is no distension.     Palpations: Abdomen is soft.     Tenderness: There is abdominal tenderness in the epigastric area, periumbilical area and left upper quadrant. There is left CVA tenderness.  Musculoskeletal:     Cervical back: Neck supple.  Skin:    General: Skin is warm and dry.     Findings: No rash.  Neurological:     Mental Status: She is alert.     Comments: Clear speech.   Psychiatric:        Behavior: Behavior normal.     ED Results /  Procedures / Treatments   Labs (all labs ordered are listed, but only abnormal results are displayed) Labs Reviewed  LIPASE, BLOOD - Abnormal; Notable for the following components:      Result Value   Lipase 62 (*)    All other components within normal limits  COMPREHENSIVE METABOLIC PANEL - Abnormal; Notable for the following components:   Glucose, Bld 119 (*)    Total Protein 8.3 (*)    ALT 47 (*)    All other components within normal limits  CBC - Abnormal; Notable for the following components:   WBC 11.4 (*)    All other components within normal limits  URINALYSIS, ROUTINE W REFLEX MICROSCOPIC  I-STAT BETA HCG BLOOD, ED ([facility], [facility], [facility] ONLY)    EKG None  Radiology No results found.  Procedures Procedures   Medications Ordered in ED Medications - No data to display  ED Course  I have reviewed the triage vital signs and the nursing notes.  Pertinent labs & imaging results that were available during my care of the patient were reviewed by me and considered in my medical decision making (see chart for details).    MDM Rules/Calculators/A&P                         Patient presents to the ED with complaints of abdominal pain. Patient appears uncomfortable, however nontoxic, vitals without significant abnormality, BP somewhat elevated.  DDx: Nephrolithiasis, pyelonephritis, diverticulitis, perforation, obstruction, pancreatitis, cholecystitis, symptomatic cholelithiasis, choledocholithiasis, musculoskeletal pain, ectopic pregnancy.  Additional history obtained:  Additional history obtained from chart review nursing note reviewed.  Lab Tests:  I Ordered, reviewed, and interpreted labs, which included:  CBC: Mild leukocytosis at 11.4. CMP: No significant electrolyte derangement. Lipase: Minimal elevation, significantly improved compared to prior. Pregnancy test: Negative Urinalysis: Moderate leukocytes, however nitrate negative and rare bacteria, sent for culture. She is  not   I have ordered dilaudid for pain, zofran for nausea, & fluids for hydration. Plan for CT A/P for further assessment.   Imaging Studies ordered:  I ordered imaging studies which included CT A/P- this is pending.   07:00: Patient care signed out to PA [facility] @ change of shift pending CT & disposition.   Portions of this note were generated with Scientist, clinical (histocompatibility and immunogenetics). Dictation errors may occur despite best attempts at proofreading.  Final Clinical Impression(s) / ED Diagnoses Final diagnoses:  None    Rx / DC Orders ED Discharge Orders    None       Cherly Anderson, PA-C 09/15/20 0710  Shon Baton, MD 09/15/20 [phone]

## 2020-09-16 LAB — URINE CULTURE: Culture: NO GROWTH

## 2020-09-25 ENCOUNTER — Other Ambulatory Visit: Payer: Self-pay | Admitting: Surgery

## 2022-10-28 IMAGING — CT CT ABD-PELV W/ CM
2 of 4 series · 16 of 46 positions shown, 18 images · IV contrast (OMNIPAQUE 300)
Comparison: 08/26/2020

CLINICAL DATA: Left lower quadrant abdominal pain with nausea and
vomiting for several hours.

EXAM:
CT ABDOMEN AND PELVIS WITH CONTRAST
TECHNIQUE: Multidetector CT imaging of the abdomen and pelvis was performed
using the standard protocol following bolus administration of
intravenous contrast.
CONTRAST:  100mL OMNIPAQUE IOHEXOL 300 MG/ML  SOLN

[Series 2: axial st · axial · 0.72mm/px · z∈[-446,-40]mm · 13 of 91 slices shown, 15 images]
[im 5/91  soft-tissue]
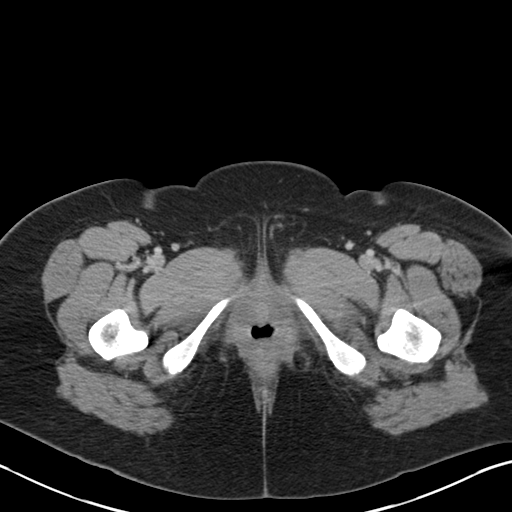
[im 5/91  bone]
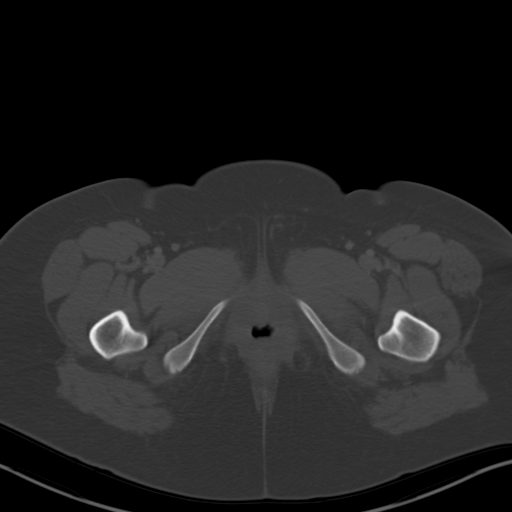
[im 14/91  soft-tissue]
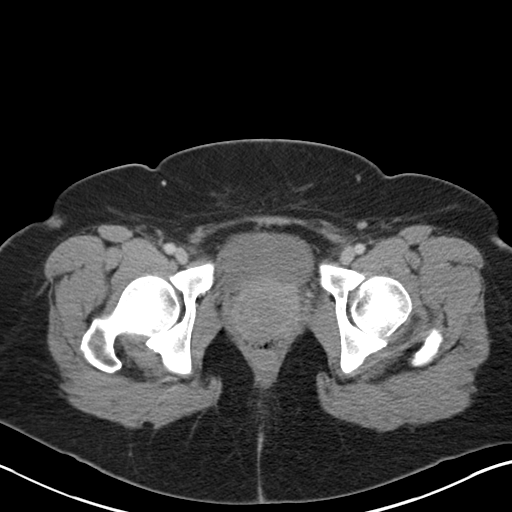
[im 19/91  soft-tissue]
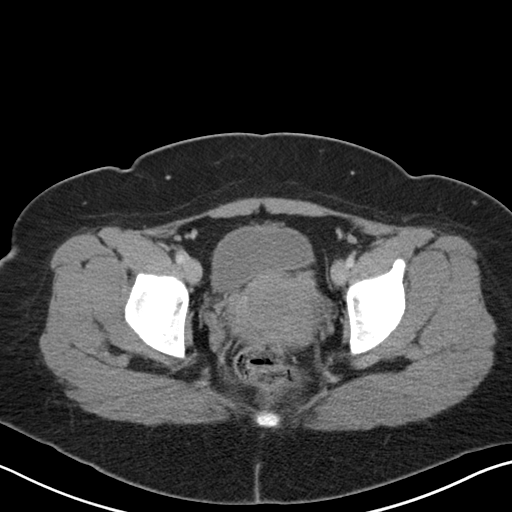
[im 28/91  soft-tissue]
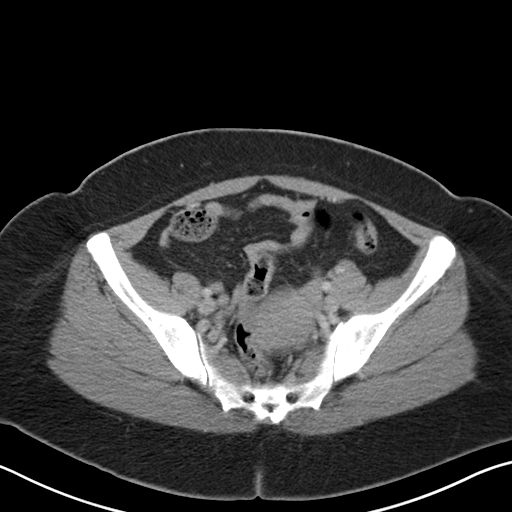
[im 32/91  soft-tissue]
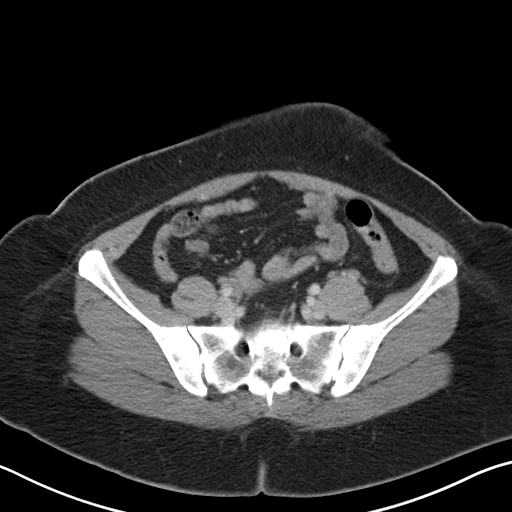
[im 41/91  soft-tissue]
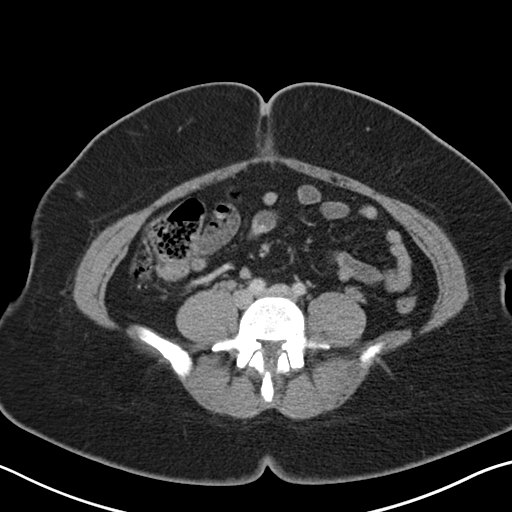
[im 46/91  soft-tissue]
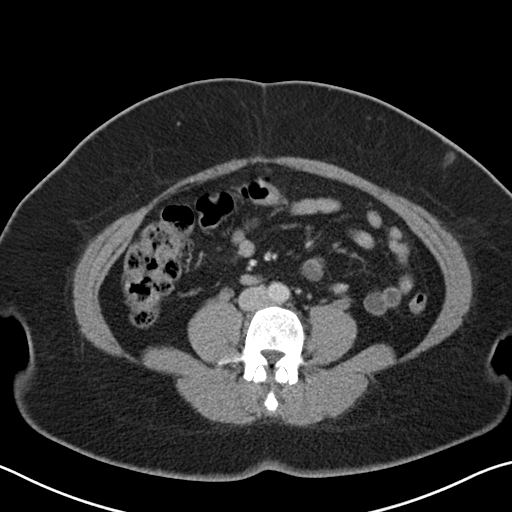
[im 50/91  soft-tissue]
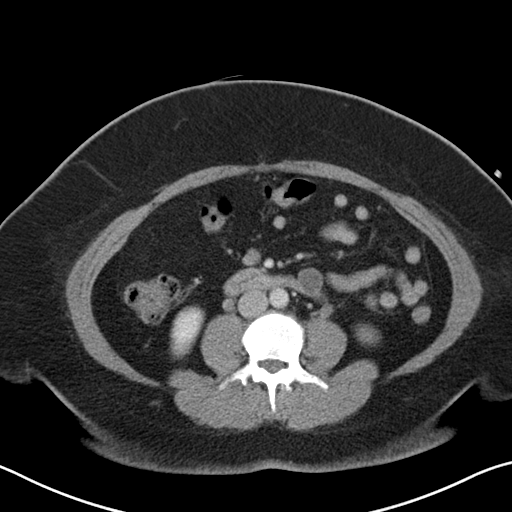
[im 59/91  soft-tissue]
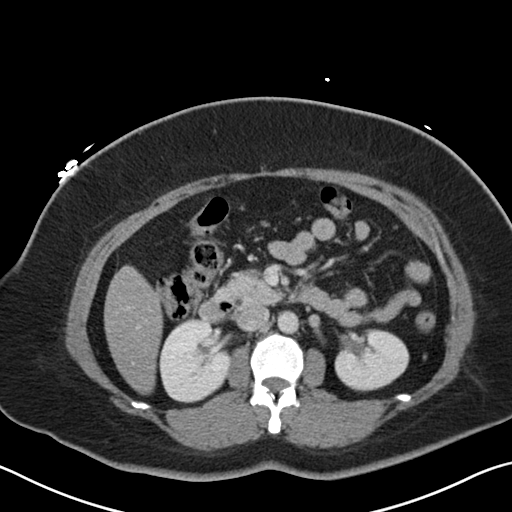
[im 59/91  bone]
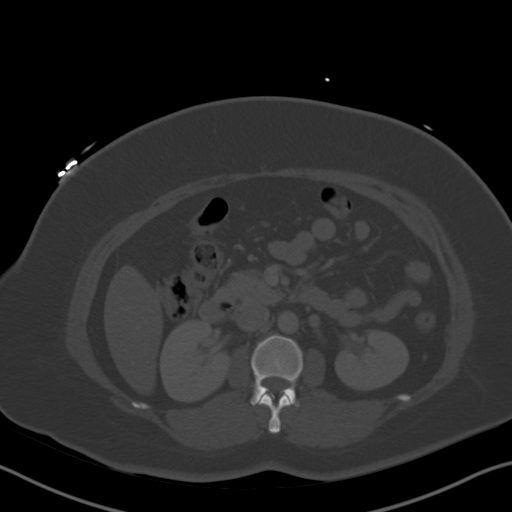
[im 64/91  soft-tissue]
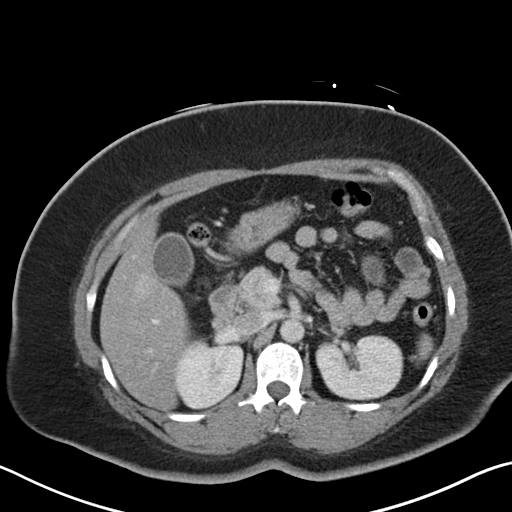
[im 73/91  soft-tissue]
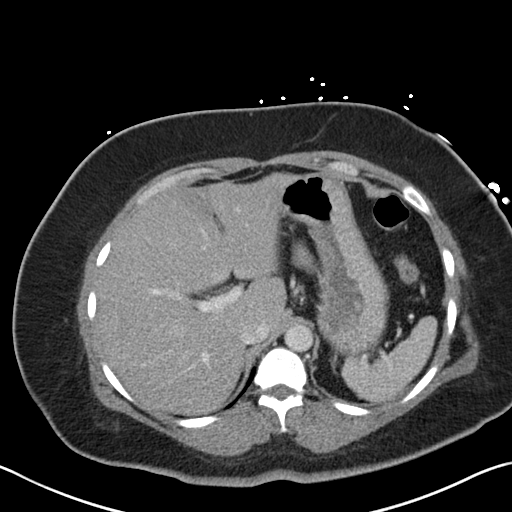
[im 77/91  soft-tissue]
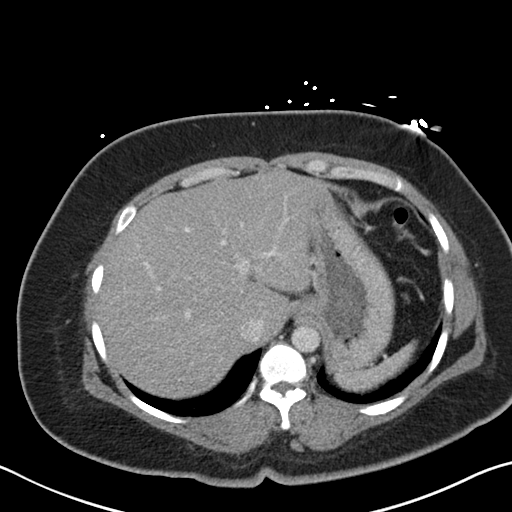
[im 86/91  soft-tissue]
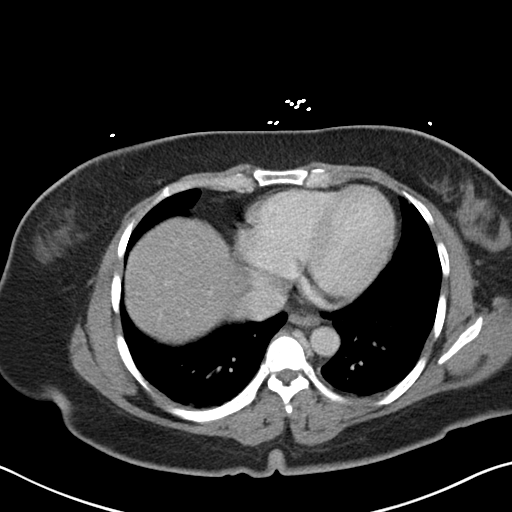

[Series 4: coronal st · coronal · 0.71mm/px · 3 of 135 slices shown]
[im 45/135  soft-tissue]
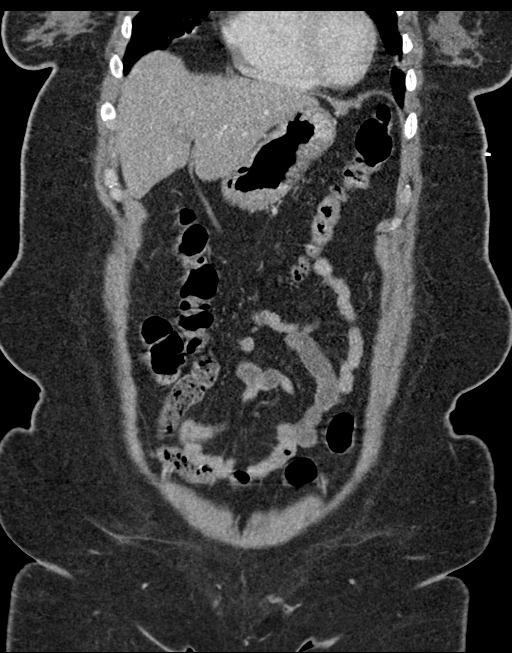
[im 60/135  soft-tissue]
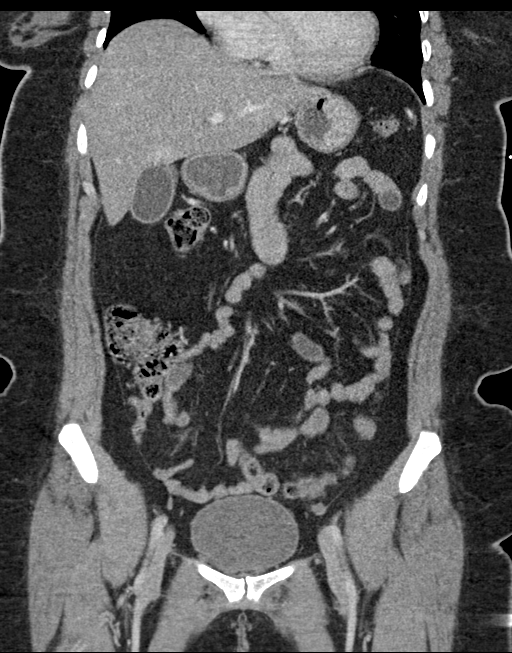
[im 75/135  soft-tissue]
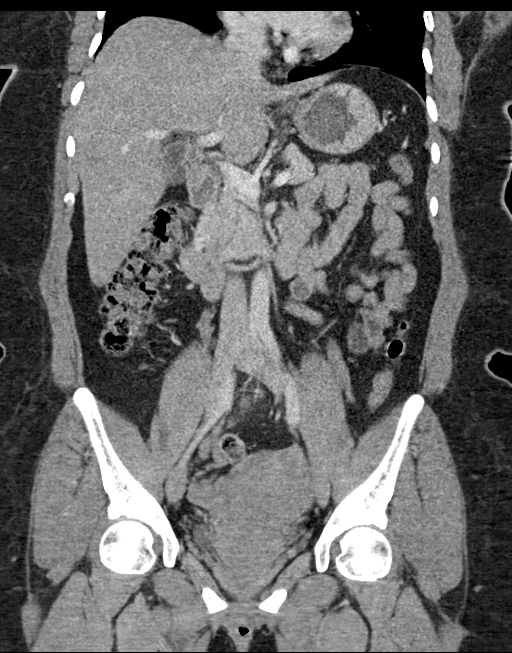

[16 of 46 positions shown; findings below may reference images not displayed]

FINDINGS: Lower chest:  No contributory findings.

Hepatobiliary: Hepatic steatosis. No focal abnormality.No evidence
of biliary obstruction or stone.

Pancreas: Unremarkable.

Spleen: Unremarkable.

Adrenals/Urinary Tract: Negative adrenals. No hydronephrosis or
stone. Unremarkable bladder.

Stomach/Bowel:  No obstruction. No appendicitis.

Vascular/Lymphatic: No acute vascular abnormality. No mass or
adenopathy.

Reproductive:No pathologic findings.  Corpus luteum on the left.

Other: No ascites or pneumoperitoneum.  Fatty umbilical hernia.

Musculoskeletal: No acute abnormalities.
IMPRESSION: 1. No acute finding.
2. Hepatic steatosis.

## 2024-01-03 ENCOUNTER — Encounter: Payer: Self-pay | Admitting: Internal Medicine

## 2024-01-03 ENCOUNTER — Ambulatory Visit: Payer: Self-pay | Admitting: Internal Medicine

## 2024-01-03 VITALS — BP 122/88 | HR 75 | Resp 16 | Ht <= 58 in | Wt 189.0 lb

## 2024-01-03 DIAGNOSIS — K851 Biliary acute pancreatitis without necrosis or infection: Secondary | ICD-10-CM

## 2024-01-03 DIAGNOSIS — N3000 Acute cystitis without hematuria: Secondary | ICD-10-CM

## 2024-01-03 DIAGNOSIS — E669 Obesity, unspecified: Secondary | ICD-10-CM

## 2024-01-03 DIAGNOSIS — K8020 Calculus of gallbladder without cholecystitis without obstruction: Secondary | ICD-10-CM

## 2024-01-03 DIAGNOSIS — K760 Fatty (change of) liver, not elsewhere classified: Secondary | ICD-10-CM

## 2024-01-03 DIAGNOSIS — M545 Low back pain, unspecified: Secondary | ICD-10-CM

## 2024-01-03 NOTE — Progress Notes (Signed)
 Subjective:    Patient ID: Kelsey Thomas, female   DOB: 01/30/1981, 43 y.o.   MRN: [id number]   HPI  Here to establish  Kelsey Thomas interprets   Low back pain:  fell 2 weeks ago, landing on left buttock.  Low left back/upper buttock pain.  Currently, only bothers her when tired or if on feet for prolonged periods of time.  Works Education officer, environmental at [employer].  No numbness, tingling or weakness into left leg.  Can have extension of burning sensation into left thoracic back.  Only with the low back pain and when it is more severe.  Ibuprofen  800 helps significantly.  Taking with food.    2.  Gallstone pancreatitis in 2022:  she had already passed a stone and pancreatitis/liver enzymes were decreasing.  She was never able to get into Gen Surg visit with Dr. Lucienne Ryder.  Changed her diet and has not recurred with symptoms.  Current Meds  Medication Sig   ibuprofen  (ADVIL ) 200 MG tablet Take 600-800 mg by mouth every 6 (six) hours as needed for mild pain.   No Known Allergies  Past Medical History:  Diagnosis Date   Asymptomatic gallstones 2022   However, in 2022, was felt to have passed a gallstone, which caused acute pancreatitis, did not follow up with Dr. Lucienne Ryder after ED visit..   Hepatic steatosis 2022   Pancreatitis, gallstone 2022   UTI (urinary tract infection) 08/18/2020   History reviewed. No pertinent surgical history.  Family History  Problem Relation Age of Onset   Cholecystitis Brother    Family Status  Relation Name Status   Mother  Alive, age 80y   Father  Alive, age 47y   Sister  Alive   Sister  Alive   Sister  Alive   Sister  Alive   Sister  Alive   Sister  Alive   Sister  Alive   Brother  Alive   Daughter Research scientist (life sciences), age 16y   Daughter Rosaline Alive, age 13y   Daughter Marieta Shorten, age 9y  No partnership data on file   Social History   Socioeconomic History   Marital status: Legally Separated    Spouse name: Not on file   Number of  children: 3   Years of education: Not on file   Highest education level: Not on file  Occupational History   Occupation: Contractor    Comment: [employer]  Tobacco Use   Smoking status: Never    Passive exposure: Never   Smokeless tobacco: Never  Vaping Use   Vaping status: Never Used  Substance and Sexual Activity   Alcohol use: No   Drug use: No   Sexual activity: Yes    Birth control/protection: None  Other Topics Concern   Not on file  Social History Narrative   Separated from previous domestic female partner   He is the father of her two youngest daughters   He does help with support.   Father of oldest daughter does not support.   He does keep in touch with his daughter--eldest daughter lives with maternal grandmother in [place].     Social Drivers of Corporate investment banker Strain: Not on file  Food Insecurity: No Food Insecurity (01/03/2024)   Hunger Vital Sign    Worried About Running Out of Food in the Last Year: Never true    Ran Out of Food in the Last Year: Never true  Transportation Needs: No Transportation Needs (  01/03/2024)   PRAPARE - Administrator, Civil Service (Medical): No    Lack of Transportation (Non-Medical): No  Physical Activity: Not on file  Stress: Not on file  Social Connections: Not on file  Intimate Partner Violence: Not At Risk (01/03/2024)   Humiliation, Afraid, Rape, and Kick questionnaire    Fear of Current or Ex-Partner: No    Emotionally Abused: No    Physically Abused: No    Sexually Abused: No       Review of Systems    Objective:   BP 122/88 (BP Location: Left Arm, Patient Position: Sitting, Cuff Size: Normal)   Pulse 75   Resp 16   Ht 4' 10 (1.473 m)   Wt 189 lb (85.7 kg)   LMP 12/25/2023 (Approximate)   SpO2 96%   Breastfeeding No   BMI 39.50 kg/m   Physical Exam NAD HEENT:  PERRL, EOMI, TMs pearly gray, throat without injection Neck:  Supple, No adenopathy, no thyromegaly Chest:   CTA CV:  RRR with normal S1 and S2, No S3, S4 or murmur.  No carotid bruits.  Carotid, radial and DP pulses normal and equal Abd:  S, NT, No HSM or mass, + BS MS:  Mild tenderness of Left lumbar back musculature.  NT over spinous processes   Neuro:  A & O X3, CN  II-XII grossly intact,  Motor 5/5 and DTRs 2+/4 throughout.  Gait normal.       Assessment & Plan   Left low back pain:  muscular.  Stretches.  Ibuprofen  twice daily with meals for 1-2 weeks then only as needed.  Call if does not continue to improve.    2.  Obesity/hepatic steatosis:  Discussed making dietary and physical activity goals on weekly basis.  Return for fasting labs:  FLP, CBC, A1C, TSH.  Refer to [facility] and nutrition classes.  3.  Cholelithiasis with history of gallstone pancreatitis is past.  Currently without issues.  Follow for now.  Did not have cholecystectomy with episode of pancreatitis and did not follow up with surgery.  4.

## 2024-01-03 NOTE — Patient Instructions (Signed)
 Tome un vaso de agua antes de cada comida Tome un minimo de 6 a 8 vasos de agua diarios Coma tres veces al dia Coma una proteina y Neomia Dear grasa saludable con comida.  (huevos, pescado, pollo, pavo, y limite carnes rojas Coma 5 porciones diarias de legumbres.  Mezcle los colores Coma 2 porciones diarias de frutas con cascara cuando sea comestible Use platos pequeos Suelte su tenedor o cuchara despues de cada mordida hata que se mastique y se trague Come en la mesa con amigos o familiares por lo menos una vez al dia Apague la televisin y aparatos electrnicos durante la comida  Su objetivo debe ser perder una libra por semana  Estudios recientes indican que las personas quienes consumen todos de sus calorias durante 12 horas se bajan de pesocon Mas eficiencia.  Por ejemplo, si Usted come su primera comida a las 7:00 a.m., su comida final del dia se debe completar antes de las 7:00 p.m.
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
## 2024-01-16 ENCOUNTER — Other Ambulatory Visit: Payer: Self-pay

## 2024-01-16 DIAGNOSIS — K760 Fatty (change of) liver, not elsewhere classified: Secondary | ICD-10-CM

## 2024-01-16 DIAGNOSIS — Z0000 Encounter for general adult medical examination without abnormal findings: Secondary | ICD-10-CM

## 2024-01-17 LAB — LIPID PANEL W/O CHOL/HDL RATIO
Cholesterol, Total: 152 mg/dL (ref 100–199)
HDL: 45 mg/dL (ref >39)
LDL Chol Calc (NIH): 87 mg/dL (ref 0–99)
Triglycerides: 108 mg/dL (ref 0–149)
VLDL Cholesterol Cal: 20 mg/dL (ref 5–40)

## 2024-01-17 LAB — CBC WITH DIFFERENTIAL/PLATELET
Basophils Absolute: 0 10*3/uL (ref 0.0–0.2)
Basos: 0 %
EOS (ABSOLUTE): 0.2 10*3/uL (ref 0.0–0.4)
Eos: 2 %
Hematocrit: 44 % (ref 34.0–46.6)
Hemoglobin: 14.7 g/dL (ref 11.1–15.9)
Immature Grans (Abs): 0 10*3/uL (ref 0.0–0.1)
Immature Granulocytes: 0 %
Lymphocytes Absolute: 5.8 10*3/uL — ABNORMAL HIGH (ref 0.7–3.1)
Lymphs: 53 %
MCH: 33 pg (ref 26.6–33.0)
MCHC: 33.4 g/dL (ref 31.5–35.7)
MCV: 99 fL — ABNORMAL HIGH (ref 79–97)
Monocytes Absolute: 0.6 10*3/uL (ref 0.1–0.9)
Monocytes: 5 %
Neutrophils Absolute: 4.4 10*3/uL (ref 1.4–7.0)
Neutrophils: 40 %
Platelets: 401 10*3/uL (ref 150–450)
RBC: 4.46 x10E6/uL (ref 3.77–5.28)
RDW: 13.6 % (ref 11.7–15.4)
WBC: 11 10*3/uL — ABNORMAL HIGH (ref 3.4–10.8)

## 2024-01-17 LAB — HEPATITIS C ANTIBODY: Hep C Virus Ab: NONREACTIVE

## 2024-01-17 LAB — TSH: TSH: 3.2 u[IU]/mL (ref 0.450–4.500)

## 2024-01-17 LAB — COMPREHENSIVE METABOLIC PANEL WITH GFR
ALT: 64 IU/L — ABNORMAL HIGH (ref 0–32)
AST: 40 IU/L (ref 0–40)
Albumin: 4.5 g/dL (ref 3.9–4.9)
Alkaline Phosphatase: 84 IU/L (ref 44–121)
BUN/Creatinine Ratio: 17 (ref 9–23)
BUN: 12 mg/dL (ref 6–24)
Bilirubin Total: 0.3 mg/dL (ref 0.0–1.2)
CO2: 19 mmol/L — ABNORMAL LOW (ref 20–29)
Calcium: 9.9 mg/dL (ref 8.7–10.2)
Chloride: 105 mmol/L (ref 96–106)
Creatinine, Ser: 0.71 mg/dL (ref 0.57–1.00)
Globulin, Total: 2.8 g/dL (ref 1.5–4.5)
Glucose: 100 mg/dL — ABNORMAL HIGH (ref 70–99)
Potassium: 4.9 mmol/L (ref 3.5–5.2)
Sodium: 138 mmol/L (ref 134–144)
Total Protein: 7.3 g/dL (ref 6.0–8.5)
eGFR: 109 mL/min/{1.73_m2} (ref >59)

## 2024-01-17 LAB — HEMOGLOBIN A1C
Est. average glucose Bld gHb Est-mCnc: 123 mg/dL
Hgb A1c MFr Bld: 5.9 % — ABNORMAL HIGH (ref 4.8–5.6)

## 2024-01-26 ENCOUNTER — Ambulatory Visit: Payer: Self-pay | Admitting: Internal Medicine

## 2024-01-26 DIAGNOSIS — R7401 Elevation of levels of liver transaminase levels: Secondary | ICD-10-CM | POA: Insufficient documentation

## 2024-02-09 ENCOUNTER — Other Ambulatory Visit: Payer: Self-pay

## 2024-03-08 ENCOUNTER — Other Ambulatory Visit: Payer: Self-pay

## 2024-03-08 DIAGNOSIS — R7401 Elevation of levels of liver transaminase levels: Secondary | ICD-10-CM

## 2024-03-09 LAB — HEPATITIS B CORE ANTIBODY, TOTAL: Hep B Core Total Ab: NEGATIVE

## 2024-03-09 LAB — HEPATITIS B SURFACE ANTIBODY,QUALITATIVE: Hep B Surface Ab, Qual: NONREACTIVE

## 2024-03-09 LAB — HEPATITIS B SURFACE ANTIGEN: Hepatitis B Surface Ag: NEGATIVE

## 2024-03-09 LAB — HEPATITIS A ANTIBODY, TOTAL: hep A Total Ab: POSITIVE — AB

## 2024-04-24 ENCOUNTER — Telehealth: Payer: Self-pay

## 2024-04-24 NOTE — Telephone Encounter (Signed)
 Telephoned patient at mobile number using interpreter#[phone]. Patient will call back and complete screening process. [facility]

## 2024-04-25 ENCOUNTER — Other Ambulatory Visit: Payer: Self-pay | Admitting: Obstetrics & Gynecology

## 2024-04-25 DIAGNOSIS — Z1231 Encounter for screening mammogram for malignant neoplasm of breast: Secondary | ICD-10-CM

## 2024-05-06 ENCOUNTER — Ambulatory Visit: Payer: Self-pay | Admitting: Internal Medicine

## 2024-05-14 ENCOUNTER — Ambulatory Visit (INDEPENDENT_AMBULATORY_CARE_PROVIDER_SITE_OTHER): Payer: Self-pay

## 2024-05-14 DIAGNOSIS — Z23 Encounter for immunization: Secondary | ICD-10-CM

## 2024-06-06 ENCOUNTER — Ambulatory Visit
Admission: RE | Admit: 2024-06-06 | Discharge: 2024-06-06 | Disposition: A | Discharge status: Home / Self Care | Payer: Self-pay | Source: Ambulatory Visit | Attending: Obstetrics & Gynecology | Admitting: Obstetrics & Gynecology

## 2024-06-06 DIAGNOSIS — Z1231 Encounter for screening mammogram for malignant neoplasm of breast: Secondary | ICD-10-CM

## 2024-06-12 ENCOUNTER — Other Ambulatory Visit: Payer: Self-pay

## 2024-06-12 ENCOUNTER — Other Ambulatory Visit: Payer: Self-pay | Admitting: Obstetrics and Gynecology

## 2024-06-12 DIAGNOSIS — R928 Other abnormal and inconclusive findings on diagnostic imaging of breast: Secondary | ICD-10-CM

## 2024-06-26 ENCOUNTER — Encounter: Payer: Self-pay | Admitting: *Deleted

## 2024-07-05 ENCOUNTER — Ambulatory Visit: Payer: Self-pay | Admitting: Nurse Practitioner

## 2024-07-05 VITALS — BP 138/89 | Wt 178.5 lb

## 2024-07-05 DIAGNOSIS — R928 Other abnormal and inconclusive findings on diagnostic imaging of breast: Secondary | ICD-10-CM

## 2024-07-05 NOTE — Progress Notes (Signed)
 [name] is a 43 y.o. female who presents to [facility] clinic today with call back for possible asymmetry in the right breast. Denies breast complaints such as nipple discharge or breast pain.   Pap Smear: Pap smear not completed today. Last Pap smear was 06/26/2024 at health dept and was benign. Last Pap smear result is available in Epic.   Physical exam: Breasts Breasts symmetrical. No skin abnormalities bilateral breasts. Bilateral nipple retraction without inversion or discharge. Scattered dense tissue throughout both breasts, no palpable mass or nodularity in either breast or axilla that I could appreciate.        Pelvic/Bimanual Pap is not indicated today    Smoking History: Patient is a non smoker.   Patient Navigation: Patient education provided. Access to services provided for patient through [facility] program. Bernice Angry Spanish interpreter provided.    Colorectal Cancer Screening: Colonoscopy not indicated due to age and no complaints today.    Breast and Cervical Cancer Risk Assessment: Patient does not have family history of breast cancer, known genetic mutations, or radiation treatment to the chest before age 89. Patient does not have history of cervical dysplasia, immunocompromised, or DES exposure in-utero.   A: [facility] exam without pap smear Complaint of abnormal first screening mammogram, c density noted. No complaints. Exam negative for discrete mass.   P: Exam consistently Currently scheduled for diagnostic R mammo/US  at [facility] on 07/09/2024. She was encouraged to keep this appointment and was given [facility]'s direct number in the event she needs to reschedule.    Calissa Swenor K, NP 07/05/2024

## 2024-07-09 ENCOUNTER — Ambulatory Visit
Admission: RE | Admit: 2024-07-09 | Discharge: 2024-07-09 | Disposition: A | Discharge status: Home / Self Care | Source: Ambulatory Visit | Attending: Obstetrics and Gynecology | Admitting: Obstetrics and Gynecology

## 2024-07-09 DIAGNOSIS — R928 Other abnormal and inconclusive findings on diagnostic imaging of breast: Secondary | ICD-10-CM

## 2024-07-24 ENCOUNTER — Encounter: Payer: Self-pay | Admitting: Internal Medicine

## 2024-07-24 ENCOUNTER — Ambulatory Visit: Payer: Self-pay | Admitting: Internal Medicine

## 2024-07-24 VITALS — BP 140/90 | HR 64 | Resp 18 | Ht <= 58 in | Wt 179.0 lb

## 2024-07-24 DIAGNOSIS — H11003 Unspecified pterygium of eye, bilateral: Secondary | ICD-10-CM | POA: Insufficient documentation

## 2024-07-24 DIAGNOSIS — Z0000 Encounter for general adult medical examination without abnormal findings: Secondary | ICD-10-CM

## 2024-07-24 DIAGNOSIS — R7303 Prediabetes: Secondary | ICD-10-CM

## 2024-07-24 DIAGNOSIS — Z23 Encounter for immunization: Secondary | ICD-10-CM

## 2024-07-24 DIAGNOSIS — K029 Dental caries, unspecified: Secondary | ICD-10-CM | POA: Insufficient documentation

## 2024-07-24 MED ORDER — CALCIUM CITRATE 250 MG PO TABS: ORAL_TABLET | ORAL | Status: AC

## 2024-07-24 MED ORDER — VITAMIN D3 25 MCG (1000 UT) PO CAPS: 1000.0000 [IU] | ORAL_CAPSULE | Freq: Every day | ORAL | Status: AC

## 2024-07-24 NOTE — Progress Notes (Signed)
 Subjective:    Patient ID: Kelsey Thomas, female   DOB: [birth year], 43 y.o.   MRN: [id number]   HPI  Kelsey Thomas interprets  CPE without pap  1.  Pap:  Had this performed at [facility].  She states she has not heard back regarding results.  Was treated for unknown infection once daily for 6 days.  2.  Mammogram:  Performed through [facility] 05/2024 with asymmetry that resolved on repeat mammogram 07/09/2024 new views.  No family history of breast cancer.    3.  Osteoprevention:  Does not have regular intake of milk, cheese or yogurt. She is physically active with work--housekeeping at hotel.  Nothing outside of work.    4.  Guaiac Cards/FIT:  Never.  5.  Colonoscopy:  Never.  No family history of colon cancer.    6.  Immunizations:  Needs Td, 2nd Hep B and has not had influenza nor COVID. Immunization History  Administered Date(s) Administered   Hepatitis B, ADULT 05/14/2024   Influenza,inj,Quad PF,6+ Mos 06/13/2014   PFIZER(Purple Top)SARS-COV-2 Vaccination 12/21/2019, 01/09/2020   Tdap 07/18/2014     7.  Glucose/Cholesterol: Prediabetes with A1C of 5.9% in June of 2025.  Cholesterol panel was fine in June as well.  She has made changes to lifestyle, though no increase in physical activity.  She has had a 10 lb weight loss since May.   Lipid Panel     Component Value Date/Time   CHOL 152 01/16/2024 0844   TRIG 108 01/16/2024 0844   HDL 45 01/16/2024 0844   CHOLHDL 3.7 08/27/2020 0428   VLDL 14 08/27/2020 0428   LDLCALC 87 01/16/2024 0844   LABVLDL 20 01/16/2024 0844     Current Meds  Medication Sig   ibuprofen  (ADVIL ) 200 MG tablet Take 600-800 mg by mouth every 6 (six) hours as needed for mild pain.   No Known Allergies  Past Medical History:  Diagnosis Date   Asymptomatic gallstones 2022   However, in 2022, was felt to have passed a gallstone, which caused acute pancreatitis, did not follow up with Dr. Vernetta after ED visit..   Hepatic steatosis 2022    Pancreatitis, gallstone 2022   Prediabetes 01/2024   UTI (urinary tract infection) 08/18/2020   History reviewed. No pertinent surgical history.  Family History  Problem Relation Age of Onset   Cholecystitis Brother    Social History   Socioeconomic History   Marital status: Legally Separated    Spouse name: Not on file   Number of children: 3   Years of education: Not on file   Highest education level: Not on file  Occupational History   Occupation: Contractor    Comment: [employer]  Tobacco Use   Smoking status: Never    Passive exposure: Never   Smokeless tobacco: Never  Vaping Use   Vaping status: Never Used  Substance and Sexual Activity   Alcohol use: No   Drug use: No   Sexual activity: Yes    Birth control/protection: None  Other Topics Concern   Not on file  Social History Narrative   Separated from previous domestic female partner   He is the father of her two youngest daughters   He does help with support.   Father of oldest daughter does not support.   Lives at home with her 2 youngest daughters.   He does keep in touch with his daughter--eldest daughter lives with maternal grandmother in [place].  Social Drivers of Corporate Investment Banker Strain: Low Risk  (07/24/2024)   Overall Financial Resource Strain (CARDIA)    Difficulty of Paying Living Expenses: Not very hard  Food Insecurity: No Food Insecurity (07/24/2024)   Hunger Vital Sign    Worried About Running Out of Food in the Last Year: Never true    Ran Out of Food in the Last Year: Never true  Transportation Needs: No Transportation Needs (07/24/2024)   PRAPARE - Administrator, Civil Service (Medical): No    Lack of Transportation (Non-Medical): No  Physical Activity: Not on file  Stress: Not on file  Social Connections: Not on file  Intimate Partner Violence: Not At Risk (07/24/2024)   Humiliation, Afraid, Rape, and Kick questionnaire    Fear of Current or  Ex-Partner: No    Emotionally Abused: No    Physically Abused: No    Sexually Abused: No      Review of Systems  HENT:  Positive for dental problem (cavities.  Filling fell off).   Eyes:  Negative for visual disturbance.  Respiratory:  Negative for shortness of breath.   Cardiovascular:  Negative for chest pain, palpitations and leg swelling.  Gastrointestinal:  Negative for abdominal pain and blood in stool (No melena.).      Objective:   BP (!) 140/90 (BP Location: Right Arm, Patient Position: Sitting, Cuff Size: Normal)   Pulse 64   Resp 18   Ht 4' 10 (1.473 m)   Wt 179 lb (81.2 kg)   LMP 07/20/2024   BMI 37.41 kg/m   Physical Exam Constitutional:      Appearance: She is obese.  HENT:     Head: Normocephalic and atraumatic.     Right Ear: Tympanic membrane, ear canal and external ear normal.     Left Ear: Tympanic membrane, ear canal and external ear normal.     Nose: Nose normal.     Mouth/Throat:     Mouth: Mucous membranes are moist.     Pharynx: Oropharynx is clear.  Eyes:     Extraocular Movements: Extraocular movements intact.     Conjunctiva/sclera: Conjunctivae normal.     Pupils: Pupils are equal, round, and reactive to light.     Comments: Discs sharp Bilateral pterygium, temporal.  Both up against out edge of iris.  Neck:     Thyroid: No thyroid mass or thyromegaly.  Cardiovascular:     Rate and Rhythm: Normal rate and regular rhythm.     Heart sounds: S1 normal and S2 normal. No murmur heard.    No friction rub. No S3 or S4 sounds.     Comments: No carotid bruits.  Carotid, radial, femoral, DP and PT pulses normal and equal.   Pulmonary:     Effort: Pulmonary effort is normal.     Breath sounds: Normal breath sounds and air entry.  Chest:     Comments: Deferred to [facility]  Abdominal:     General: Bowel sounds are normal.     Palpations: Abdomen is soft. There is no hepatomegaly, splenomegaly or mass.     Tenderness: There is no abdominal  tenderness.     Hernia: No hernia is present.  Genitourinary:    Comments: Deferred as recently performed with [facility] Musculoskeletal:        General: Normal range of motion.     Cervical back: Normal range of motion and neck supple.     Right lower leg: No edema.  Left lower leg: No edema.  Lymphadenopathy:     Head:     Right side of head: No submental or submandibular adenopathy.     Left side of head: No submental or submandibular adenopathy.     Cervical: No cervical adenopathy.     Upper Body:     Right upper body: No supraclavicular adenopathy.     Left upper body: No supraclavicular adenopathy.     Lower Body: No right inguinal adenopathy. No left inguinal adenopathy.  Skin:    General: Skin is warm.     Capillary Refill: Capillary refill takes less than 2 seconds.     Findings: No rash.  Neurological:     General: No focal deficit present.     Mental Status: She is alert and oriented to person, place, and time.     Cranial Nerves: Cranial nerves 2-12 are intact.     Sensory: Sensation is intact.     Motor: Motor function is intact.     Coordination: Coordination is intact.     Gait: Gait is intact.     Deep Tendon Reflexes: Reflexes are normal and symmetric.  Psychiatric:        Mood and Affect: Mood normal.        Behavior: Behavior normal.      Assessment & Plan   CPE without pap Mammogram recently completed. Hep B #2/3 Td Declined Spikevax/COvID despite recommendation Influenza not available--place on list. FIT to return in 1 week.  2.  Elevated BP:  recheck in 1 week, nurse visit.  Influenza vaccine if available.   3.  Pterygium, bilateral:  to wear sunglasses when outside  4.  Obesity/prediabetes:  continue to work on lifestyle with weekly goals for eating and physical activity.  A1C  5  Cavities:  Dental referral.

## 2024-07-24 NOTE — Patient Instructions (Signed)
 Tome un vaso de agua antes de cada comida Tome un minimo de 6 a 8 vasos de agua diarios Coma tres veces al dia Coma una proteina y Neomia Dear grasa saludable con comida.  (huevos, pescado, pollo, pavo, y limite carnes rojas Coma 5 porciones diarias de legumbres.  Mezcle los colores Coma 2 porciones diarias de frutas con cascara cuando sea comestible Use platos pequeos Suelte su tenedor o cuchara despues de cada mordida hata que se mastique y se trague Come en la mesa con amigos o familiares por lo menos una vez al dia Apague la televisin y aparatos electrnicos durante la comida  Su objetivo debe ser perder una libra por semana  Estudios recientes indican que las personas quienes consumen todos de sus calorias durante 12 horas se bajan de pesocon Mas eficiencia.  Por ejemplo, si Usted come su primera comida a las 7:00 a.m., su comida final del dia se debe completar antes de las 7:00 p.m.

## 2024-07-25 LAB — HGB A1C W/O EAG: Hgb A1c MFr Bld: 5.5 % (ref 4.8–5.6)

## 2024-07-31 ENCOUNTER — Ambulatory Visit: Payer: Self-pay

## 2024-07-31 VITALS — BP 144/90 | HR 62

## 2024-07-31 DIAGNOSIS — Z0000 Encounter for general adult medical examination without abnormal findings: Secondary | ICD-10-CM

## 2024-07-31 DIAGNOSIS — Z23 Encounter for immunization: Secondary | ICD-10-CM

## 2024-07-31 DIAGNOSIS — Z0130 Encounter for examination of blood pressure without abnormal findings: Secondary | ICD-10-CM

## 2024-07-31 LAB — POC FIT TEST STOOL: Fecal Occult Blood: NEGATIVE

## 2024-07-31 NOTE — Progress Notes (Unsigned)
 Bp today = 140 /90 and pulse 63 She is not getting andy bp medication

## 2025-01-24 ENCOUNTER — Other Ambulatory Visit: Payer: Self-pay

## 2025-01-29 ENCOUNTER — Ambulatory Visit: Payer: Self-pay | Admitting: Internal Medicine

## 2025-07-25 ENCOUNTER — Other Ambulatory Visit: Payer: Self-pay | Admitting: Internal Medicine

## 2025-07-29 ENCOUNTER — Encounter: Payer: Self-pay | Admitting: Internal Medicine
